# Patient Record
Sex: Male | Born: 1950
Health system: Southern US, Community
[De-identification: ages and names within clinical notes are randomized; demographics above are authoritative.]

## PROBLEM LIST (undated history)

## (undated) DIAGNOSIS — M1990 Unspecified osteoarthritis, unspecified site: Secondary | ICD-10-CM

## (undated) DIAGNOSIS — N400 Enlarged prostate without lower urinary tract symptoms: Secondary | ICD-10-CM

## (undated) DIAGNOSIS — I10 Essential (primary) hypertension: Secondary | ICD-10-CM

## (undated) DIAGNOSIS — E785 Hyperlipidemia, unspecified: Secondary | ICD-10-CM

## (undated) DIAGNOSIS — J309 Allergic rhinitis, unspecified: Secondary | ICD-10-CM

## (undated) DIAGNOSIS — F411 Generalized anxiety disorder: Secondary | ICD-10-CM

## (undated) HISTORY — DX: Generalized anxiety disorder: F41.1

## (undated) HISTORY — PX: CERVICAL SPINE SURGERY: SHX589

## (undated) HISTORY — DX: Hyperlipidemia, unspecified: E78.5

## (undated) HISTORY — PX: HERNIA REPAIR: SHX51

## (undated) HISTORY — DX: Allergic rhinitis, unspecified: J30.9

## (undated) HISTORY — DX: Essential (primary) hypertension: I10

## (undated) HISTORY — DX: Benign prostatic hyperplasia without lower urinary tract symptoms: N40.0

## (undated) HISTORY — DX: Unspecified osteoarthritis, unspecified site: M19.90

## (undated) HISTORY — PX: TONSILLECTOMY AND ADENOIDECTOMY: SHX28

---

## 1977-06-16 HISTORY — PX: GALLBLADDER SURGERY: SHX652

## 2019-08-19 ENCOUNTER — Other Ambulatory Visit: Payer: Self-pay

## 2019-08-19 ENCOUNTER — Ambulatory Visit: Payer: Medicare Other | Attending: Internal Medicine

## 2019-08-19 DIAGNOSIS — Z23 Encounter for immunization: Secondary | ICD-10-CM | POA: Insufficient documentation

## 2019-08-19 NOTE — Progress Notes (Signed)
   Covid-19 Vaccination Clinic  Name:  Johnathan Dominguez    MRN: [id number] DOB: May 28, 1951  08/19/2019  Johnathan Dominguez was observed post Covid-19 immunization for 15 minutes without incident. He was provided with Vaccine Information Sheet and instruction to access the V-Safe system.   Johnathan Dominguez was instructed to call 911 with any severe reactions post vaccine: Marland Kitchen Difficulty breathing  . Swelling of face and throat  . A fast heartbeat  . A bad rash all over body  . Dizziness and weakness

## 2019-08-25 ENCOUNTER — Encounter: Payer: Self-pay | Admitting: Primary Care

## 2019-08-25 ENCOUNTER — Other Ambulatory Visit: Payer: Self-pay

## 2019-08-25 ENCOUNTER — Ambulatory Visit (INDEPENDENT_AMBULATORY_CARE_PROVIDER_SITE_OTHER): Payer: Medicare Other | Admitting: Primary Care

## 2019-08-25 VITALS — BP 136/86 | HR 71 | Temp 97.0°F | Ht 66.0 in | Wt 229.5 lb

## 2019-08-25 DIAGNOSIS — I10 Essential (primary) hypertension: Secondary | ICD-10-CM | POA: Diagnosis not present

## 2019-08-25 DIAGNOSIS — Z125 Encounter for screening for malignant neoplasm of prostate: Secondary | ICD-10-CM

## 2019-08-25 DIAGNOSIS — J309 Allergic rhinitis, unspecified: Secondary | ICD-10-CM | POA: Diagnosis not present

## 2019-08-25 DIAGNOSIS — F411 Generalized anxiety disorder: Secondary | ICD-10-CM

## 2019-08-25 DIAGNOSIS — N400 Enlarged prostate without lower urinary tract symptoms: Secondary | ICD-10-CM

## 2019-08-25 DIAGNOSIS — M1990 Unspecified osteoarthritis, unspecified site: Secondary | ICD-10-CM

## 2019-08-25 DIAGNOSIS — E785 Hyperlipidemia, unspecified: Secondary | ICD-10-CM | POA: Diagnosis not present

## 2019-08-25 LAB — CBC
HCT: 44.9 % (ref 39.0–52.0)
Hemoglobin: 14.8 g/dL (ref 13.0–17.0)
MCHC: 33 g/dL (ref 30.0–36.0)
MCV: 89.1 fl (ref 78.0–100.0)
Platelets: 222 10*3/uL (ref 150.0–400.0)
RBC: 5.03 Mil/uL (ref 4.22–5.81)
RDW: 13.3 % (ref 11.5–15.5)
WBC: 6.2 10*3/uL (ref 4.0–10.5)

## 2019-08-25 LAB — COMPREHENSIVE METABOLIC PANEL
ALT: 62 U/L — ABNORMAL HIGH (ref 0–53)
AST: 37 U/L (ref 0–37)
Albumin: 4 g/dL (ref 3.5–5.2)
Alkaline Phosphatase: 77 U/L (ref 39–117)
BUN: 10 mg/dL (ref 6–23)
CO2: 27 mEq/L (ref 19–32)
Calcium: 9.2 mg/dL (ref 8.4–10.5)
Chloride: 102 mEq/L (ref 96–112)
Creatinine, Ser: 0.91 mg/dL (ref 0.40–1.50)
GFR: 82.3 mL/min (ref >60.00)
Glucose, Bld: 265 mg/dL — ABNORMAL HIGH (ref 70–99)
Potassium: 4.2 mEq/L (ref 3.5–5.1)
Sodium: 136 mEq/L (ref 135–145)
Total Bilirubin: 0.9 mg/dL (ref 0.2–1.2)
Total Protein: 6.4 g/dL (ref 6.0–8.3)

## 2019-08-25 LAB — LIPID PANEL
Cholesterol: 167 mg/dL (ref 0–200)
HDL: 37 mg/dL — ABNORMAL LOW (ref >39.00)
LDL Cholesterol: 107 mg/dL — ABNORMAL HIGH (ref 0–99)
NonHDL: 130.3
Total CHOL/HDL Ratio: 5
Triglycerides: 115 mg/dL (ref 0.0–149.0)
VLDL: 23 mg/dL (ref 0.0–40.0)

## 2019-08-25 LAB — PSA, MEDICARE: PSA: 3.1 ng/ml (ref 0.10–4.00)

## 2019-08-25 NOTE — Assessment & Plan Note (Signed)
Chronic to bilateral knees, does well with Meloxicam daily. Renal function pending. Continue same.

## 2019-08-25 NOTE — Assessment & Plan Note (Signed)
Nocturia improved on tamsulosin. Continue same.

## 2019-08-25 NOTE — Assessment & Plan Note (Signed)
Doing well on Veramyst. Was seeing ENT PRN in [place].  Overall doing well.

## 2019-08-25 NOTE — Assessment & Plan Note (Signed)
Intolerant to statin therapy, severe myalgias.  Doing well on Zetia, lipid panel pending.

## 2019-08-25 NOTE — Progress Notes (Signed)
Subjective:    Patient ID: Johnathan Dominguez, male    DOB: 20-Nov-1950, 69 y.o.   MRN: [id number]  HPI  This visit occurred during the SARS-CoV-2 public health emergency.  Safety protocols were in place, including screening questions prior to the visit, additional usage of staff PPE, and extensive cleaning of exam room while observing appropriate contact time as indicated for disinfecting solutions.   Johnathan Dominguez is a 69 year old male who presents today to establish care and discuss the problems mentioned below. Will obtain/review records.  1) Essential Hypertension: Currently managed on valsartan 160 mg. He does not check his BP at home. He denies chest pain, dizziness, headaches.   BP Readings from Last 3 Encounters:  08/25/19 136/86     2) Hyperlipidemia: Currently managed on Zetia 10 mg. His last lipid panel was about 6 months ago. He has intolerance to statin medications which cause severe lower extremity myalgias. Overall does well on Zetia.   3) BPH: Currently managed on tamsulosin 0.4 mg daily. Initially placed on tamsulosin for nocturia, now waking 1-2 times nightly. He denies difficulty initiating stream, hematuria, incomplete bladder empty.  4) GAD: Currently managed on venlafaxine XR 150 mg for which he's been taking 4-5 years. "Helps to keep me mellow". Denies Si/HI.   5) Chronic Allergies: Currently managed on Veramyst. History of sinus procedure and had sinuses "cleaned out".   6) Osteoarthritis: Chronic pain to bilateral knees. Managed on Meloxicam 15 mg for which he takes daily and is doing well.   Review of Systems  Eyes: Negative for visual disturbance.  Respiratory: Negative for shortness of breath.   Cardiovascular: Negative for chest pain.  Neurological: Negative for dizziness and headaches.       Past Medical History:  Diagnosis Date  . Allergic rhinitis   . BPH (benign prostatic hyperplasia)   . Essential hypertension   . GAD (generalized anxiety  disorder)   . Hyperlipidemia   . Osteoarthritis      Social History   Socioeconomic History  . Marital status: Married    Spouse name: Not on file  . Number of children: Not on file  . Years of education: Not on file  . Highest education level: Not on file  Occupational History  . Not on file  Tobacco Use  . Smoking status: Never Smoker  . Smokeless tobacco: Never Used  Substance and Sexual Activity  . Alcohol use: Not Currently  . Drug use: Not on file  . Sexual activity: Not on file  Other Topics Concern  . Not on file  Social History Narrative  . Not on file   Social Determinants of Health   Financial Resource Strain:   . Difficulty of Paying Living Expenses:   Food Insecurity:   . Worried About Charity fundraiser in the Last Year:   . Arboriculturist in the Last Year:   Transportation Needs:   . Film/video editor (Medical):   Marland Kitchen Lack of Transportation (Non-Medical):   Physical Activity:   . Days of Exercise per Week:   . Minutes of Exercise per Session:   Stress:   . Feeling of Stress :   Social Connections:   . Frequency of Communication with Friends and Family:   . Frequency of Social Gatherings with Friends and Family:   . Attends Religious Services:   . Active Member of Clubs or Organizations:   . Attends Archivist Meetings:   Marland Kitchen Marital Status:  Intimate Partner Violence:   . Fear of Current or Ex-Partner:   . Emotionally Abused:   Marland Kitchen Physically Abused:   . Sexually Abused:     Past Surgical History:  Procedure Laterality Date  . CERVICAL SPINE SURGERY    . [place]  . HERNIA REPAIR    . TONSILLECTOMY AND ADENOIDECTOMY      Family History  Problem Relation Age of Onset  . Heart disease Mother   . Hypertension Mother   . Heart disease Maternal Grandfather     Allergies  Allergen Reactions  . Statins     Current Outpatient Medications on File Prior to Visit  Medication Sig Dispense Refill  . ezetimibe  (ZETIA) 10 MG tablet Take 10 mg by mouth daily.    . fluticasone (VERAMYST) 27.5 MCG/SPRAY nasal spray Place 1 spray into the nose daily.    . meloxicam (MOBIC) 15 MG tablet Take 15 mg by mouth daily.    . tamsulosin (FLOMAX) 0.4 MG CAPS capsule Take 0.4 mg by mouth.    . valsartan (DIOVAN) 160 MG tablet Take 160 mg by mouth daily.    Marland Kitchen venlafaxine XR (EFFEXOR-XR) 150 MG 24 hr capsule Take 150 mg by mouth daily with breakfast.     No current facility-administered medications on file prior to visit.    BP 136/86   Pulse 71   Temp (!) 97 F (36.1 C) (Temporal)   Ht 5\' 6"  (1.676 m)   Wt 209 lb 8 oz (95 kg)   SpO2 98%   BMI 33.81 kg/m    Objective:   Physical Exam  Constitutional: He appears well-nourished.  Cardiovascular: Normal rate and regular rhythm.  Respiratory: Effort normal and breath sounds normal.  Musculoskeletal:     Cervical back: Neck supple.  Skin: Skin is warm and dry.  Psychiatric: He has a normal mood and affect.           Assessment & Plan:

## 2019-08-25 NOTE — Assessment & Plan Note (Signed)
Stable in the office today. CMP pending. Continue valsartan.

## 2019-08-25 NOTE — Assessment & Plan Note (Signed)
Doing well on venlafaxine ER 150 mg. Continue same. Denies SI/HI.

## 2019-08-25 NOTE — Patient Instructions (Signed)
Stop by the lab prior to leaving today. I will notify you of your results once received.   It was a pleasure to meet you today! Please don't hesitate to call or message me with any questions. Welcome to [facility]!

## 2019-08-26 ENCOUNTER — Other Ambulatory Visit (INDEPENDENT_AMBULATORY_CARE_PROVIDER_SITE_OTHER): Payer: Medicare Other

## 2019-08-26 DIAGNOSIS — R739 Hyperglycemia, unspecified: Secondary | ICD-10-CM | POA: Diagnosis not present

## 2019-08-26 LAB — HEMOGLOBIN A1C: Hgb A1c MFr Bld: 12.6 % — ABNORMAL HIGH (ref 4.6–6.5)

## 2019-08-30 ENCOUNTER — Encounter: Payer: Self-pay | Admitting: *Deleted

## 2019-09-01 ENCOUNTER — Encounter: Payer: Self-pay | Admitting: Primary Care

## 2019-09-01 ENCOUNTER — Ambulatory Visit (INDEPENDENT_AMBULATORY_CARE_PROVIDER_SITE_OTHER): Payer: Medicare Other | Admitting: Primary Care

## 2019-09-01 ENCOUNTER — Other Ambulatory Visit: Payer: Self-pay

## 2019-09-01 ENCOUNTER — Telehealth: Payer: Self-pay

## 2019-09-01 DIAGNOSIS — E119 Type 2 diabetes mellitus without complications: Secondary | ICD-10-CM

## 2019-09-01 DIAGNOSIS — E1165 Type 2 diabetes mellitus with hyperglycemia: Secondary | ICD-10-CM | POA: Insufficient documentation

## 2019-09-01 MED ORDER — METFORMIN HCL ER 500 MG PO TB24: 1000.0000 mg | ORAL_TABLET | Freq: Every day | ORAL | 3 refills | Status: DC

## 2019-09-01 MED ORDER — BLOOD GLUCOSE MONITOR KIT: PACK | 0 refills | Status: DC

## 2019-09-01 MED ORDER — GLIPIZIDE ER 10 MG PO TB24: 10.0000 mg | ORAL_TABLET | Freq: Every day | ORAL | 3 refills | Status: DC

## 2019-09-01 NOTE — Patient Instructions (Signed)
Start Metformin ER 500 mg tablets for diabetes. Take 2 tablets every morning with breakfast for diabetes.  Start Glipizide XL 10 mg once daily with breakfast for diabetes.  Start checking your blood sugar levels.  Appropriate times to check your blood sugar levels are:  -Before any meal (breakfast, lunch, dinner) -Two hours after any meal (breakfast, lunch, dinner) -Bedtime  Record your readings and notify me if you continue to consistently run at or above 200.  Please schedule a follow up appointment in 6 weeks for diabetes.   It was a pleasure to see you today!   Diabetes Mellitus and Nutrition, Adult When you have diabetes (diabetes mellitus), it is very important to have healthy eating habits because your blood sugar (glucose) levels are greatly affected by what you eat and drink. Eating healthy foods in the appropriate amounts, at about the same times every day, can help you:  Control your blood glucose.  Lower your risk of heart disease.  Improve your blood pressure.  Reach or maintain a healthy weight. Every person with diabetes is different, and each person has different needs for a meal plan. Your health care provider may recommend that you work with a diet and nutrition specialist (dietitian) to make a meal plan that is best for you. Your meal plan may vary depending on factors such as:  The calories you need.  The medicines you take.  Your weight.  Your blood glucose, blood pressure, and cholesterol levels.  Your activity level.  Other health conditions you have, such as heart or kidney disease. How do carbohydrates affect me? Carbohydrates, also called carbs, affect your blood glucose level more than any other type of food. Eating carbs naturally raises the amount of glucose in your blood. Carb counting is a method for keeping track of how many carbs you eat. Counting carbs is important to keep your blood glucose at a healthy level, especially if you use insulin  or take certain oral diabetes medicines. It is important to know how many carbs you can safely have in each meal. This is different for every person. Your dietitian can help you calculate how many carbs you should have at each meal and for each snack. Foods that contain carbs include:  Bread, cereal, rice, pasta, and crackers.  Potatoes and corn.  Peas, beans, and lentils.  Milk and yogurt.  Fruit and juice.  Desserts, such as cakes, cookies, ice cream, and candy. How does alcohol affect me? Alcohol can cause a sudden decrease in blood glucose (hypoglycemia), especially if you use insulin or take certain oral diabetes medicines. Hypoglycemia can be a life-threatening condition. Symptoms of hypoglycemia (sleepiness, dizziness, and confusion) are similar to symptoms of having too much alcohol. If your health care provider says that alcohol is safe for you, follow these guidelines:  Limit alcohol intake to no more than 1 drink per day for nonpregnant women and 2 drinks per day for men. One drink equals 12 oz of beer, 5 oz of wine, or 1 oz of hard liquor.  Do not drink on an empty stomach.  Keep yourself hydrated with water, diet soda, or unsweetened iced tea.  Keep in mind that regular soda, juice, and other mixers may contain a lot of sugar and must be counted as carbs. What are tips for following this plan?  Reading food labels  Start by checking the serving size on the "Nutrition Facts" label of packaged foods and drinks. The amount of calories, carbs, fats, and other  nutrients listed on the label is based on one serving of the item. Many items contain more than one serving per package.  Check the total grams (g) of carbs in one serving. You can calculate the number of servings of carbs in one serving by dividing the total carbs by 15. For example, if a food has 30 g of total carbs, it would be equal to 2 servings of carbs.  Check the number of grams (g) of saturated and trans fats  in one serving. Choose foods that have low or no amount of these fats.  Check the number of milligrams (mg) of salt (sodium) in one serving. Most people should limit total sodium intake to less than 2,300 mg per day.  Always check the nutrition information of foods labeled as "low-fat" or "nonfat". These foods may be higher in added sugar or refined carbs and should be avoided.  Talk to your dietitian to identify your daily goals for nutrients listed on the label. Shopping  Avoid buying canned, premade, or processed foods. These foods tend to be high in fat, sodium, and added sugar.  Shop around the outside edge of the grocery store. This includes fresh fruits and vegetables, bulk grains, fresh meats, and fresh dairy. Cooking  Use low-heat cooking methods, such as baking, instead of high-heat cooking methods like deep frying.  Cook using healthy oils, such as olive, canola, or sunflower oil.  Avoid cooking with butter, cream, or high-fat meats. Meal planning  Eat meals and snacks regularly, preferably at the same times every day. Avoid going long periods of time without eating.  Eat foods high in fiber, such as fresh fruits, vegetables, beans, and whole grains. Talk to your dietitian about how many servings of carbs you can eat at each meal.  Eat 4-6 ounces (oz) of lean protein each day, such as lean meat, chicken, fish, eggs, or tofu. One oz of lean protein is equal to: ? 1 oz of meat, chicken, or fish. ? 1 egg. ?  cup of tofu.  Eat some foods each day that contain healthy fats, such as avocado, nuts, seeds, and fish. Lifestyle  Check your blood glucose regularly.  Exercise regularly as told by your health care provider. This may include: ? 150 minutes of moderate-intensity or vigorous-intensity exercise each week. This could be brisk walking, biking, or water aerobics. ? Stretching and doing strength exercises, such as yoga or weightlifting, at least 2 times a week.  Take  medicines as told by your health care provider.  Do not use any products that contain nicotine or tobacco, such as cigarettes and e-cigarettes. If you need help quitting, ask your health care provider.  Work with a Social worker or diabetes educator to identify strategies to manage stress and any emotional and social challenges. Questions to ask a health care provider  Do I need to meet with a diabetes educator?  Do I need to meet with a dietitian?  What number can I call if I have questions?  When are the best times to check my blood glucose? Where to find more information:  American Diabetes Association: diabetes.org  Academy of Nutrition and Dietetics: www.eatright.[employer] of Diabetes and Digestive and Kidney Diseases (NIH): [employer] Summary  A healthy meal plan will help you control your blood glucose and maintain a healthy lifestyle.  Working with a diet and nutrition specialist (dietitian) can help you make a meal plan that is best for you.  Keep in mind that carbohydrates (  carbs) and alcohol have immediate effects on your blood glucose levels. It is important to count carbs and to use alcohol carefully. This information is not intended to replace advice given to you by your health care provider. Make sure you discuss any questions you have with your health care provider. Document Revised: 05/15/2017 Document Reviewed: 07/07/2016 Elsevier Patient Education  2020 [employer].

## 2019-09-01 NOTE — Addendum Note (Signed)
Addended by: Jacqualin Combes on: 09/01/2019 02:03 PM   Modules accepted: Orders

## 2019-09-01 NOTE — Telephone Encounter (Signed)
Pt said [facility] did not get the glucose meter,test strips and lancets rx. Pt will ck with his ins.co to see what diabetic supplies his ins co approves. Pt will cb with information. [facility] [address]. Will also need to use E11.9 for dx code.

## 2019-09-01 NOTE — Telephone Encounter (Signed)
Called [facility] and had the pharmacist re-check to fax machine and at the same time, I had re-fax the script.

## 2019-09-01 NOTE — Assessment & Plan Note (Signed)
Diagnosed 4-5 years ago, no treatment really ever.  Rx for Metformin ER 500 mg sent to pharmacy. Will have him take 1000 mg once daily to start. May increase to 1000 mg BID if he tolerates well.  Given his A1C level of 12.6, I recommended we treat with long acting insulin, he kindly declines. He does agree to dual oral agent therapy so we will send in Glipizide XL 10 mg.   Rx for glucometer kit sent to pharmacy, we discussed how and when to check glucose levels. Glucose log sheets provided. We will see him back in 6 weeks with glucose logs.

## 2019-09-01 NOTE — Progress Notes (Signed)
Subjective:    Patient ID: Johnathan Dominguez, male    DOB: December 22, 1950, 69 y.o.   MRN: [id number]  HPI  This visit occurred during the SARS-CoV-2 public health emergency.  Safety protocols were in place, including screening questions prior to the visit, additional usage of staff PPE, and extensive cleaning of exam room while observing appropriate contact time as indicated for disinfecting solutions.   Mr. Entrikin is a 69 year old male with a history of hypertension, hyperlipidemia who presents today to discuss diabetes. He was last evaluated last week as a new patient, lab work completed with A1C result of 12.6.   During his initial visit he made no mention of a diabetes history, therefore, he was brought in today. Today he mentions a 5-6 year history of diabetes.  He was once managed on Metformin twice daily which caused diarrhea and GI upset so he didn't take this for very long. He has not taken Metformin in about one year.   He denies numbness/tingling, polyuria, polydipsia, chest pain, dizziness. He does not check his blood sugars.   Review of Systems  Constitutional: Negative for fatigue.  Eyes: Negative for visual disturbance.  Respiratory: Negative for shortness of breath.   Cardiovascular: Negative for chest pain.  Neurological: Negative for dizziness and numbness.       Past Medical History:  Diagnosis Date  . Allergic rhinitis   . BPH (benign prostatic hyperplasia)   . Essential hypertension   . GAD (generalized anxiety disorder)   . Hyperlipidemia   . Osteoarthritis      Social History   Socioeconomic History  . Marital status: Married    Spouse name: Not on file  . Number of children: Not on file  . Years of education: Not on file  . Highest education level: Not on file  Occupational History  . Not on file  Tobacco Use  . Smoking status: Never Smoker  . Smokeless tobacco: Never Used  Substance and Sexual Activity  . Alcohol use: Not Currently  . Drug  use: Not on file  . Sexual activity: Not on file  Other Topics Concern  . Not on file  Social History Narrative  . Not on file   Social Determinants of Health   Financial Resource Strain:   . Difficulty of Paying Living Expenses:   Food Insecurity:   . Worried About Charity fundraiser in the Last Year:   . Arboriculturist in the Last Year:   Transportation Needs:   . Film/video editor (Medical):   Marland Kitchen Lack of Transportation (Non-Medical):   Physical Activity:   . Days of Exercise per Week:   . Minutes of Exercise per Session:   Stress:   . Feeling of Stress :   Social Connections:   . Frequency of Communication with Friends and Family:   . Frequency of Social Gatherings with Friends and Family:   . Attends Religious Services:   . Active Member of Clubs or Organizations:   . Attends Archivist Meetings:   Marland Kitchen Marital Status:   Intimate Partner Violence:   . Fear of Current or Ex-Partner:   . Emotionally Abused:   Marland Kitchen Physically Abused:   . Sexually Abused:     Past Surgical History:  Procedure Laterality Date  . CERVICAL SPINE SURGERY    . [place]  . HERNIA REPAIR    . TONSILLECTOMY AND ADENOIDECTOMY      Family History  Problem Relation Age of Onset  . Heart disease Mother   . Hypertension Mother   . Heart disease Maternal Grandfather     Allergies  Allergen Reactions  . Statins     Current Outpatient Medications on File Prior to Visit  Medication Sig Dispense Refill  . ezetimibe (ZETIA) 10 MG tablet Take 10 mg by mouth daily.    . fluticasone (VERAMYST) 27.5 MCG/SPRAY nasal spray Place 1 spray into the nose daily.    . meloxicam (MOBIC) 15 MG tablet Take 15 mg by mouth daily.    . tamsulosin (FLOMAX) 0.4 MG CAPS capsule Take 0.4 mg by mouth.    . valsartan (DIOVAN) 160 MG tablet Take 160 mg by mouth daily.    Marland Kitchen venlafaxine XR (EFFEXOR-XR) 150 MG 24 hr capsule Take 150 mg by mouth daily with breakfast.     No current  facility-administered medications on file prior to visit.    BP 134/84   Pulse 73   Temp (!) 96.8 F (36 C) (Temporal)   Ht 5\' 6"  (1.676 m)   Wt 224 lb 8 oz (101.8 kg)   SpO2 97%   BMI 36.24 kg/m    Objective:   Physical Exam  Constitutional: He appears well-nourished.  Cardiovascular: Normal rate and regular rhythm.  Respiratory: Effort normal and breath sounds normal.  Musculoskeletal:     Cervical back: Neck supple.  Skin: Skin is warm and dry.  Psychiatric: He has a normal mood and affect.           Assessment & Plan:

## 2019-09-07 ENCOUNTER — Other Ambulatory Visit: Payer: Self-pay

## 2019-09-07 MED ORDER — ONETOUCH ULTRA VI STRP: ORAL_STRIP | 12 refills | Status: DC

## 2019-09-07 NOTE — Telephone Encounter (Signed)
Received fax from [facility] asking to re send test strips with updated sig of checking sugar once daily due to insurance and patient is not on insulin. RX resent with updated sig.

## 2019-09-09 ENCOUNTER — Ambulatory Visit: Payer: Medicare Other | Attending: Internal Medicine

## 2019-09-09 DIAGNOSIS — Z23 Encounter for immunization: Secondary | ICD-10-CM

## 2019-09-09 NOTE — Progress Notes (Signed)
   Covid-19 Vaccination Clinic  Name:  Johnathan Dominguez    MRN: [id number] DOB: 15-Oct-1950  09/09/2019  Mr. Sheetz was observed post Covid-19 immunization for 15 minutes without incident. He was provided with Vaccine Information Sheet and instruction to access the V-Safe system.   Mr. Merryweather was instructed to call 911 with any severe reactions post vaccine: Marland Kitchen Difficulty breathing  . Swelling of face and throat  . A fast heartbeat  . A bad rash all over body  . Dizziness and weakness   Immunizations Administered    Name Date Dose VIS Date Route   Pfizer COVID-19 Vaccine 09/09/2019 10:49 AM 0.3 mL 05/27/2019 Intramuscular   Manufacturer: [place]   Lot: [id number]   [place]: [id number]

## 2019-09-13 ENCOUNTER — Telehealth: Payer: Self-pay | Admitting: Primary Care

## 2019-09-13 NOTE — Telephone Encounter (Signed)
Rec'd from [place] forwarded 33 pages to Dr. Alma Friendly

## 2019-09-15 ENCOUNTER — Telehealth: Payer: Self-pay | Admitting: Primary Care

## 2019-09-15 NOTE — Telephone Encounter (Signed)
Patient called today stating he is needing some help learning how to use his meter. He stated that when he pricks his finger he is not getting enough blood for the reader.  Patient wanted to know if there is someone here that could help him

## 2019-09-20 NOTE — Telephone Encounter (Signed)
Spoken to patient and inform him that he may come any time between 12 pm to 1 pm, either today, Wednesday or Thursday   Patient verbalized understanding.

## 2019-10-13 ENCOUNTER — Encounter: Payer: Self-pay | Admitting: Primary Care

## 2019-10-13 ENCOUNTER — Other Ambulatory Visit: Payer: Self-pay

## 2019-10-13 ENCOUNTER — Ambulatory Visit (INDEPENDENT_AMBULATORY_CARE_PROVIDER_SITE_OTHER): Payer: Medicare Other | Admitting: Primary Care

## 2019-10-13 VITALS — BP 124/82 | HR 76 | Temp 96.7°F | Ht 66.0 in | Wt 212.0 lb

## 2019-10-13 DIAGNOSIS — Z1283 Encounter for screening for malignant neoplasm of skin: Secondary | ICD-10-CM

## 2019-10-13 DIAGNOSIS — E119 Type 2 diabetes mellitus without complications: Secondary | ICD-10-CM | POA: Diagnosis not present

## 2019-10-13 DIAGNOSIS — N400 Enlarged prostate without lower urinary tract symptoms: Secondary | ICD-10-CM

## 2019-10-13 MED ORDER — ONETOUCH ULTRA VI STRP: ORAL_STRIP | 5 refills | Status: DC

## 2019-10-13 MED ORDER — TAMSULOSIN HCL 0.4 MG PO CAPS: 0.4000 mg | ORAL_CAPSULE | Freq: Every day | ORAL | 2 refills | Status: DC

## 2019-10-13 NOTE — Assessment & Plan Note (Signed)
Glucose readings in AM are under excellent control, discussed to check at different times of the day. Commended him on weight loss.   Continue Glipizide and Metformin. Pneumonia vaccination UTD per patient, I do not yet have records. Foot exam UTD. Eye exam UTD per patient.  Follow up in 2 months for repeat A1C.

## 2019-10-13 NOTE — Assessment & Plan Note (Signed)
Refill provided for tamsulosin.

## 2019-10-13 NOTE — Progress Notes (Signed)
Subjective:    Patient ID: Johnathan Dominguez, male    DOB: 02/23/1951, 69 y.o.   MRN: [id number]  HPI  This visit occurred during the SARS-CoV-2 public health emergency.  Safety protocols were in place, including screening questions prior to the visit, additional usage of staff PPE, and extensive cleaning of exam room while observing appropriate contact time as indicated for disinfecting solutions.   Johnathan Dominguez is a 69 year old male with a history of hypertension, type 2 diabetes, BPH, hyperlipidemia who presents today for follow up of diabetes.  He was last evaluated on 09/01/19 for evaluation of A1C of 12.6, especially since he gave no indication during his initial visit on 08/25/19 that he had a history of diabetes. During his most recent visit he was initiated on Glipizide XL 10 mg, Metformin XR 1000 mg, and prescribed a glucometer. He was instructed to check glucose levels daily and follow up 6 weeks later.  Since his last visit he is checking his blood sugars once daily and is getting readings of:  AM fasting: 90's-low 120's.  He ran out of test strips one week ago so he hasn't checked his glucose for one week. He has improved his diet and is eating salads and protein.   BP Readings from Last 3 Encounters:  10/13/19 124/82  09/01/19 134/84  08/25/19 136/86   Wt Readings from Last 3 Encounters:  10/13/19 212 lb (96.2 kg)  09/01/19 224 lb 8 oz (101.8 kg)  08/25/19 229 lb 8 oz (104.1 kg)   He is also needing a refill of his tamsulosin and would like to be referred to dermatology for a skin check. He was once seeing dermatology in [place].   He saw his eye doctor in early March prior to moving to [place]. He also endorses having had three pneumonia vaccinations with his last one being last year.   Review of Systems  Eyes: Negative for visual disturbance.  Respiratory: Negative for shortness of breath.   Cardiovascular: Negative for chest pain.  Neurological: Negative for  dizziness.       Past Medical History:  Diagnosis Date  . Allergic rhinitis   . BPH (benign prostatic hyperplasia)   . Essential hypertension   . GAD (generalized anxiety disorder)   . Hyperlipidemia   . Osteoarthritis      Social History   Socioeconomic History  . Marital status: Married    Spouse name: Not on file  . Number of children: Not on file  . Years of education: Not on file  . Highest education level: Not on file  Occupational History  . Not on file  Tobacco Use  . Smoking status: Never Smoker  . Smokeless tobacco: Never Used  Substance and Sexual Activity  . Alcohol use: Not Currently  . Drug use: Not on file  . Sexual activity: Not on file  Other Topics Concern  . Not on file  Social History Narrative  . Not on file   Social Determinants of Health   Financial Resource Strain:   . Difficulty of Paying Living Expenses:   Food Insecurity:   . Worried About Charity fundraiser in the Last Year:   . Arboriculturist in the Last Year:   Transportation Needs:   . Film/video editor (Medical):   Marland Kitchen Lack of Transportation (Non-Medical):   Physical Activity:   . Days of Exercise per Week:   . Minutes of Exercise per Session:   Stress:   .  Feeling of Stress :   Social Connections:   . Frequency of Communication with Friends and Family:   . Frequency of Social Gatherings with Friends and Family:   . Attends Religious Services:   . Active Member of Clubs or Organizations:   . Attends Archivist Meetings:   Marland Kitchen Marital Status:   Intimate Partner Violence:   . Fear of Current or Ex-Partner:   . Emotionally Abused:   Marland Kitchen Physically Abused:   . Sexually Abused:     Past Surgical History:  Procedure Laterality Date  . CERVICAL SPINE SURGERY    . [place]  . HERNIA REPAIR    . TONSILLECTOMY AND ADENOIDECTOMY      Family History  Problem Relation Age of Onset  . Heart disease Mother   . Hypertension Mother   . Heart  disease Maternal Grandfather     Allergies  Allergen Reactions  . Statins     Current Outpatient Medications on File Prior to Visit  Medication Sig Dispense Refill  . blood glucose meter kit and supplies KIT Use up to four times daily as directed. (FOR ICD-9 250.00, 250.01). 1 each 0  . ezetimibe (ZETIA) 10 MG tablet Take 10 mg by mouth daily.    . fluticasone (VERAMYST) 27.5 MCG/SPRAY nasal spray Place 1 spray into the nose daily.    Marland Kitchen glipiZIDE (GLUCOTROL XL) 10 MG 24 hr tablet Take 1 tablet (10 mg total) by mouth daily with breakfast. For diabetes. 90 tablet 3  . meloxicam (MOBIC) 15 MG tablet Take 15 mg by mouth daily.    . metFORMIN (GLUCOPHAGE-XR) 500 MG 24 hr tablet Take 2 tablets (1,000 mg total) by mouth daily with breakfast. For diabetes. 180 tablet 3  . valsartan (DIOVAN) 160 MG tablet Take 160 mg by mouth daily.    Marland Kitchen venlafaxine XR (EFFEXOR-XR) 150 MG 24 hr capsule Take 150 mg by mouth daily with breakfast.     No current facility-administered medications on file prior to visit.    BP 124/82   Pulse 76   Temp (!) 96.7 F (35.9 C) (Temporal)   Ht 5' 6"  (1.676 m)   Wt 212 lb (96.2 kg)   SpO2 97%   BMI 34.22 kg/m    Objective:   Physical Exam  Constitutional: He appears well-nourished.  Cardiovascular: Normal rate and regular rhythm.  Respiratory: Effort normal and breath sounds normal.  Musculoskeletal:     Cervical back: Neck supple.  Skin: Skin is warm and dry.  Psychiatric: He has a normal mood and affect.           Assessment & Plan:

## 2019-10-13 NOTE — Patient Instructions (Signed)
Continue checking your blood sugar levels.  Appropriate times to check your blood sugar levels are:  -Before any meal (breakfast, lunch, dinner) -Two hours after any meal (breakfast, lunch, dinner) -Bedtime  Record your readings and notify me if you continue to consistently run at or above 200.  You will be contacted regarding your referral to dermatology.  Please let us know if you have not been contacted within two weeks.   Schedule a follow up visit for late June 2021.  It was a pleasure to see you today!   Diabetes Mellitus and Nutrition, Adult When you have diabetes (diabetes mellitus), it is very important to have healthy eating habits because your blood sugar (glucose) levels are greatly affected by what you eat and drink. Eating healthy foods in the appropriate amounts, at about the same times every day, can help you:  Control your blood glucose.  Lower your risk of heart disease.  Improve your blood pressure.  Reach or maintain a healthy weight. Every person with diabetes is different, and each person has different needs for a meal plan. Your health care provider may recommend that you work with a diet and nutrition specialist (dietitian) to make a meal plan that is best for you. Your meal plan may vary depending on factors such as:  The calories you need.  The medicines you take.  Your weight.  Your blood glucose, blood pressure, and cholesterol levels.  Your activity level.  Other health conditions you have, such as heart or kidney disease. How do carbohydrates affect me? Carbohydrates, also called carbs, affect your blood glucose level more than any other type of food. Eating carbs naturally raises the amount of glucose in your blood. Carb counting is a method for keeping track of how many carbs you eat. Counting carbs is important to keep your blood glucose at a healthy level, especially if you use insulin or take certain oral diabetes medicines. It is important  to know how many carbs you can safely have in each meal. This is different for every person. Your dietitian can help you calculate how many carbs you should have at each meal and for each snack. Foods that contain carbs include:  Bread, cereal, rice, pasta, and crackers.  Potatoes and corn.  Peas, beans, and lentils.  Milk and yogurt.  Fruit and juice.  Desserts, such as cakes, cookies, ice cream, and candy. How does alcohol affect me? Alcohol can cause a sudden decrease in blood glucose (hypoglycemia), especially if you use insulin or take certain oral diabetes medicines. Hypoglycemia can be a life-threatening condition. Symptoms of hypoglycemia (sleepiness, dizziness, and confusion) are similar to symptoms of having too much alcohol. If your health care provider says that alcohol is safe for you, follow these guidelines:  Limit alcohol intake to no more than 1 drink per day for nonpregnant women and 2 drinks per day for men. One drink equals 12 oz of beer, 5 oz of wine, or 1 oz of hard liquor.  Do not drink on an empty stomach.  Keep yourself hydrated with water, diet soda, or unsweetened iced tea.  Keep in mind that regular soda, juice, and other mixers may contain a lot of sugar and must be counted as carbs. What are tips for following this plan?  Reading food labels  Start by checking the serving size on the "Nutrition Facts" label of packaged foods and drinks. The amount of calories, carbs, fats, and other nutrients listed on the label is based on  one serving of the item. Many items contain more than one serving per package.  Check the total grams (g) of carbs in one serving. You can calculate the number of servings of carbs in one serving by dividing the total carbs by 15. For example, if a food has 30 g of total carbs, it would be equal to 2 servings of carbs.  Check the number of grams (g) of saturated and trans fats in one serving. Choose foods that have low or no amount  of these fats.  Check the number of milligrams (mg) of salt (sodium) in one serving. Most people should limit total sodium intake to less than 2,300 mg per day.  Always check the nutrition information of foods labeled as "low-fat" or "nonfat". These foods may be higher in added sugar or refined carbs and should be avoided.  Talk to your dietitian to identify your daily goals for nutrients listed on the label. Shopping  Avoid buying canned, premade, or processed foods. These foods tend to be high in fat, sodium, and added sugar.  Shop around the outside edge of the grocery store. This includes fresh fruits and vegetables, bulk grains, fresh meats, and fresh dairy. Cooking  Use low-heat cooking methods, such as baking, instead of high-heat cooking methods like deep frying.  Cook using healthy oils, such as olive, canola, or sunflower oil.  Avoid cooking with butter, cream, or high-fat meats. Meal planning  Eat meals and snacks regularly, preferably at the same times every day. Avoid going long periods of time without eating.  Eat foods high in fiber, such as fresh fruits, vegetables, beans, and whole grains. Talk to your dietitian about how many servings of carbs you can eat at each meal.  Eat 4-6 ounces (oz) of lean protein each day, such as lean meat, chicken, fish, eggs, or tofu. One oz of lean protein is equal to: ? 1 oz of meat, chicken, or fish. ? 1 egg. ?  cup of tofu.  Eat some foods each day that contain healthy fats, such as avocado, nuts, seeds, and fish. Lifestyle  Check your blood glucose regularly.  Exercise regularly as told by your health care provider. This may include: ? 150 minutes of moderate-intensity or vigorous-intensity exercise each week. This could be brisk walking, biking, or water aerobics. ? Stretching and doing strength exercises, such as yoga or weightlifting, at least 2 times a week.  Take medicines as told by your health care provider.  Do not  use any products that contain nicotine or tobacco, such as cigarettes and e-cigarettes. If you need help quitting, ask your health care provider.  Work with a Social worker or diabetes educator to identify strategies to manage stress and any emotional and social challenges. Questions to ask a health care provider  Do I need to meet with a diabetes educator?  Do I need to meet with a dietitian?  What number can I call if I have questions?  When are the best times to check my blood glucose? Where to find more information:  American Diabetes Association: diabetes.org  Academy of Nutrition and Dietetics: www.eatright.[employer] of Diabetes and Digestive and Kidney Diseases (NIH): [employer] Summary  A healthy meal plan will help you control your blood glucose and maintain a healthy lifestyle.  Working with a diet and nutrition specialist (dietitian) can help you make a meal plan that is best for you.  Keep in mind that carbohydrates (carbs) and alcohol have immediate effects on your  blood glucose levels. It is important to count carbs and to use alcohol carefully. This information is not intended to replace advice given to you by your health care provider. Make sure you discuss any questions you have with your health care provider. Document Revised: 05/15/2017 Document Reviewed: 07/07/2016 Elsevier Patient Education  2020 [employer].

## 2019-11-16 ENCOUNTER — Telehealth: Payer: Self-pay

## 2019-11-16 NOTE — Telephone Encounter (Signed)
Noted and agree with recommendations.  Vallarie Mare, please call to check on patient 11/17/19.

## 2019-11-16 NOTE — Telephone Encounter (Signed)
[facility] - Client TELEPHONE ADVICE RECORD [facility] Patient Name: Johnathan Dominguez [facility] Gender: Male DOB: 11-Jul-1950 Age: 69 Y 62 M 26 D Return Phone Number: [phone] (Primary) Address: City/State/Zip: [place] [address] Client [facility] - Client Client Site [place] - Day Physician Alma Friendly - NP Contact Type Call Who Is Calling Patient / Member / Family / Caregiver Call Type Triage / Clinical Relationship To Patient Self Return Phone Number [phone] (Primary) Chief Complaint Constipation Reason for Call Symptomatic / Request for Health Information Initial Comment Caller states he has not had bowel movement monday. He is constipated. Translation No Nurse Assessment Nurse: Thad Ranger, RN, Denise Date/Time (Eastern Time): 11/16/2019 9:38:55 AM Confirm and document reason for call. If symptomatic, describe symptoms. ---Caller states he has not had bowel movement Monday. He is constipated. Has the patient had close contact with a person known or suspected to have the novel coronavirus illness OR traveled / lives in area with major community spread (including international travel) in the last 14 days from the onset of symptoms? * If Asymptomatic, screen for exposure and travel within the last 14 days. ---No Does the patient have any new or worsening symptoms? ---Yes Will a triage be completed? ---Yes Related visit to physician within the last 2 weeks? ---No Does the PT have any chronic conditions? (i.e. diabetes, asthma, this includes High risk factors for pregnancy, etc.) ---Yes List chronic conditions. ---NIDDM Is this a behavioral health or substance abuse call? ---No Guidelines Guideline Title Affirmed Question Affirmed Notes Nurse Date/Time Eilene Ghazi Time) Constipation MILD constipation Carmon, RN, Langley Gauss 11/16/2019 9:39:50 AM Disp. Time Eilene Ghazi Time) Disposition Final User 11/16/2019  9:45:44 AM Home Care Yes Carmon, RN, Langley Gauss PLEASE NOTE: All timestamps contained within this report are represented as [place] Standard Time. CONFIDENTIALTY NOTICE: This fax transmission is intended only for the addressee. It contains information that is legally privileged, confidential or otherwise protected from use or disclosure. If you are not the intended recipient, you are strictly prohibited from reviewing, disclosing, copying using or disseminating any of this information or taking any action in reliance on or regarding this information. If you have received this fax in error, please notify us immediately by telephone so that we can arrange for its return to [place]. Phone: [phone], Toll-Free: [phone], Fax: [phone] Page: 2 of 2 Call Id: [id number] Caller Disagree/Comply Comply Caller Understands Yes PreDisposition Call Doctor Care Advice Given Per Guideline HOME CARE: * You should be able to treat this at home. GENERAL CONSTIPATION INSTRUCTIONS: * Eat a high fiber diet. * Drink adequate liquids. HIGH FIBER DIET: * A high fiber diet will help improve your intestinal function and soften your BM's. The fiber works by holding more water in your stools. * Try to eat fruit and vegetables at each meal (peas, prunes, citrus, apples, beans, corn). * Eat more grain foods (bran flakes, bran muffins, graham crackers, oatmeal, brown rice, and whole wheat bread. LIQUIDS: * Drink 6-8 glasses of water a day. (Caution: certain medical conditions require fluid restriction) * Prune juice is a natural laxative. CALL BACK IF: * Abdominal swelling, vomiting or fever occur * You become worse. CARE ADVICE given per Constipation (Adult) guideline.

## 2019-11-17 NOTE — Telephone Encounter (Signed)
Message left for patient to return my call.  

## 2019-11-18 ENCOUNTER — Ambulatory Visit (INDEPENDENT_AMBULATORY_CARE_PROVIDER_SITE_OTHER)
Admission: RE | Admit: 2019-11-18 | Discharge: 2019-11-18 | Disposition: A | Discharge status: Home / Self Care | Payer: Medicare Other | Source: Ambulatory Visit | Attending: Primary Care | Admitting: Primary Care

## 2019-11-18 ENCOUNTER — Encounter: Payer: Self-pay | Admitting: Primary Care

## 2019-11-18 ENCOUNTER — Ambulatory Visit (INDEPENDENT_AMBULATORY_CARE_PROVIDER_SITE_OTHER): Payer: Medicare Other | Admitting: Primary Care

## 2019-11-18 ENCOUNTER — Other Ambulatory Visit: Payer: Self-pay

## 2019-11-18 VITALS — BP 122/82 | HR 72 | Temp 95.8°F | Ht 66.0 in | Wt 207.5 lb

## 2019-11-18 DIAGNOSIS — K5900 Constipation, unspecified: Secondary | ICD-10-CM

## 2019-11-18 DIAGNOSIS — R8290 Unspecified abnormal findings in urine: Secondary | ICD-10-CM | POA: Diagnosis not present

## 2019-11-18 HISTORY — DX: Constipation, unspecified: K59.00

## 2019-11-18 LAB — POC URINALSYSI DIPSTICK (AUTOMATED)
Bilirubin, UA: NEGATIVE
Blood, UA: NEGATIVE
Glucose, UA: NEGATIVE
Ketones, UA: NEGATIVE
Leukocytes, UA: NEGATIVE
Nitrite, UA: NEGATIVE
Protein, UA: NEGATIVE
Spec Grav, UA: 1.02 (ref 1.010–1.025)
Urobilinogen, UA: 0.2 E.U./dL
pH, UA: 6 (ref 5.0–8.0)

## 2019-11-18 NOTE — Telephone Encounter (Addendum)
Spoken to patient and he stated that he is already doing this. Patient stated that it has been becoming unbearable, he believes he may have a henria. Patient would prefer to come here. I have schedule patient at 12 pm.

## 2019-11-18 NOTE — Telephone Encounter (Signed)
Patient contacted the office. He states he did have a small bowel movement yesterday, but he does not feel like he was able to empty his bowels completely. He states he does have very mild abdominal discomfort - but no other sx. He has been trying to drink more water and eating high fiber foods, but states he cannot have a complete bowel movement.

## 2019-11-18 NOTE — Assessment & Plan Note (Addendum)
Acute for the last four days, no changes in diet or water intake prior. He did have a large successful movement after 2 enemas and suppository.   Lower suspicion for obstruction given passing gas, normal bowel movements, and lack of nausea and vomiting. Exam today stable.  Check plain films of abdomen today.  Increase Miralax to BID until he has a movement, then reduce to once daily. Discussed to discontinue if he experiences diarrhea.  He will update.

## 2019-11-18 NOTE — Progress Notes (Signed)
Subjective:    Patient ID: Johnathan Dominguez, male    DOB: [birth year], 69 y.o.   MRN: [id number]  HPI  This visit occurred during the SARS-CoV-2 public health emergency.  Safety protocols were in place, including screening questions prior to the visit, additional usage of staff PPE, and extensive cleaning of exam room while observing appropriate contact time as indicated for disinfecting solutions.   Mr. Johnathan Dominguez is a 69 year old male with a history of hypertension, type 2 diabetes, BPH, hyperlipidemia who presents today with a chief complaint of constipation.  He typically has bowel movements once daily, everyday. He did not have a bowel movement during the entire day on Monday (four days ago), but felt the urge that evening. Despite the urge, he could not have a movement so he used two enemas and one suppositories. He did have a very large bowel movement that evening.   He did not have a bowel movement on Tuesday or Wednesday, but did have a small bowel movement last night (Thursday). He's had no bowel movement today. He started Miralax last night.   He's also noticed foul smelling urine, lower back pain, increased gas. He denies hematuria, dysuria, urinary frequency, bloody stools, nausea, vomiting, abdominal pain.   He endorses drinking a lot of water, also eats a healthy diet. No recent changes in his diet.    Review of Systems  Constitutional: Negative for fever.  Gastrointestinal: Positive for constipation. Negative for abdominal pain, blood in stool, nausea and vomiting.       Past Medical History:  Diagnosis Date  . Allergic rhinitis   . BPH (benign prostatic hyperplasia)   . Essential hypertension   . GAD (generalized anxiety disorder)   . Hyperlipidemia   . Osteoarthritis      Social History   Socioeconomic History  . Marital status: Married    Spouse name: Not on file  . Number of children: Not on file  . Years of education: Not on file  . Highest education  level: Not on file  Occupational History  . Not on file  Tobacco Use  . Smoking status: Never Smoker  . Smokeless tobacco: Never Used  Substance and Sexual Activity  . Alcohol use: Not Currently  . Drug use: Not on file  . Sexual activity: Not on file  Other Topics Concern  . Not on file  Social History Narrative  . Not on file   Social Determinants of Health   Financial Resource Strain:   . Difficulty of Paying Living Expenses:   Food Insecurity:   . Worried About Charity fundraiser in the Last Year:   . Arboriculturist in the Last Year:   Transportation Needs:   . Film/video editor (Medical):   Marland Kitchen Lack of Transportation (Non-Medical):   Physical Activity:   . Days of Exercise per Week:   . Minutes of Exercise per Session:   Stress:   . Feeling of Stress :   Social Connections:   . Frequency of Communication with Friends and Family:   . Frequency of Social Gatherings with Friends and Family:   . Attends Religious Services:   . Active Member of Clubs or Organizations:   . Attends Archivist Meetings:   Marland Kitchen Marital Status:   Intimate Partner Violence:   . Fear of Current or Ex-Partner:   . Emotionally Abused:   Marland Kitchen Physically Abused:   . Sexually Abused:     Past Surgical  History:  Procedure Laterality Date  . CERVICAL SPINE SURGERY    . GALLBLADDER SURGERY  1979  . HERNIA REPAIR    . TONSILLECTOMY AND ADENOIDECTOMY      Family History  Problem Relation Age of Onset  . Heart disease Mother   . Hypertension Mother   . Heart disease Maternal Grandfather     Allergies  Allergen Reactions  . Statins     Current Outpatient Medications on File Prior to Visit  Medication Sig Dispense Refill  . blood glucose meter kit and supplies KIT Use up to four times daily as directed. (FOR ICD-9 250.00, 250.01). 1 each 0  . ezetimibe (ZETIA) 10 MG tablet Take 10 mg by mouth daily.    . fluticasone (VERAMYST) 27.5 MCG/SPRAY nasal spray Place 1 spray into the  nose daily.    . glipiZIDE (GLUCOTROL XL) 10 MG 24 hr tablet Take 1 tablet (10 mg total) by mouth daily with breakfast. For diabetes. 90 tablet 3  . glucose blood (ONETOUCH ULTRA) test strip Check sugar 2-3 times daily DX E11.9 100 each 5  . meloxicam (MOBIC) 15 MG tablet Take 15 mg by mouth daily.    . metFORMIN (GLUCOPHAGE-XR) 500 MG 24 hr tablet Take 2 tablets (1,000 mg total) by mouth daily with breakfast. For diabetes. 180 tablet 3  . tamsulosin (FLOMAX) 0.4 MG CAPS capsule Take 1 capsule (0.4 mg total) by mouth daily after supper. For urination. 90 capsule 2  . valsartan (DIOVAN) 160 MG tablet Take 160 mg by mouth daily.    . venlafaxine XR (EFFEXOR-XR) 150 MG 24 hr capsule Take 150 mg by mouth daily with breakfast.     No current facility-administered medications on file prior to visit.    BP 122/82   Pulse 72   Temp (!) 95.8 F (35.4 C) (Temporal)   Ht 5' 6" (1.676 m)   Wt 207 lb 8 oz (94.1 kg)   SpO2 97%   BMI 33.49 kg/m    Objective:   Physical Exam  Constitutional: He appears well-nourished.  Respiratory: Effort normal.  GI: Soft. Bowel sounds are normal. There is no abdominal tenderness.  Skin: Skin is warm and dry.           Assessment & Plan:   

## 2019-11-18 NOTE — Telephone Encounter (Signed)
Have him try Miralax once to twice daily for the next week. Have him update me Monday next week.

## 2019-11-18 NOTE — Patient Instructions (Signed)
Complete xray(s) prior to leaving today. I will notify you of your results once received.  Continue Miralax, increase to twice daily until you have a movement, then reduce to once daily until you regulate. Stop if you experience continued diarrhea.   Ensure you are consuming 64 ounces of water daily.  Be sure to exercise regularly.   Please update me next week. It was a pleasure to see you today!   Constipation, Adult Constipation is when a person has fewer bowel movements in a week than normal, has difficulty having a bowel movement, or has stools that are dry, hard, or larger than normal. Constipation may be caused by an underlying condition. It may become worse with age if a person takes certain medicines and does not take in enough fluids. Follow these instructions at home: Eating and drinking   Eat foods that have a lot of fiber, such as fresh fruits and vegetables, whole grains, and beans.  Limit foods that are high in fat, low in fiber, or overly processed, such as french fries, hamburgers, cookies, candies, and soda.  Drink enough fluid to keep your urine clear or pale yellow. General instructions  Exercise regularly or as told by your health care provider.  Go to the restroom when you have the urge to go. Do not hold it in.  Take over-the-counter and prescription medicines only as told by your health care provider. These include any fiber supplements.  Practice pelvic floor retraining exercises, such as deep breathing while relaxing the lower abdomen and pelvic floor relaxation during bowel movements.  Watch your condition for any changes.  Keep all follow-up visits as told by your health care provider. This is important. Contact a health care provider if:  You have pain that gets worse.  You have a fever.  You do not have a bowel movement after 4 days.  You vomit.  You are not hungry.  You lose weight.  You are bleeding from the anus.  You have thin,  pencil-like stools. Get help right away if:  You have a fever and your symptoms suddenly get worse.  You leak stool or have blood in your stool.  Your abdomen is bloated.  You have severe pain in your abdomen.  You feel dizzy or you faint. This information is not intended to replace advice given to you by your health care provider. Make sure you discuss any questions you have with your health care provider. Document Revised: 05/15/2017 Document Reviewed: 11/21/2015 Elsevier Patient Education  2020 [employer].

## 2019-12-13 ENCOUNTER — Other Ambulatory Visit: Payer: Self-pay

## 2019-12-13 ENCOUNTER — Ambulatory Visit (INDEPENDENT_AMBULATORY_CARE_PROVIDER_SITE_OTHER): Payer: Medicare Other | Admitting: Primary Care

## 2019-12-13 ENCOUNTER — Encounter: Payer: Self-pay | Admitting: Primary Care

## 2019-12-13 VITALS — BP 124/84 | HR 60 | Temp 95.4°F | Ht 66.0 in | Wt 204.5 lb

## 2019-12-13 DIAGNOSIS — E119 Type 2 diabetes mellitus without complications: Secondary | ICD-10-CM | POA: Diagnosis not present

## 2019-12-13 DIAGNOSIS — N400 Enlarged prostate without lower urinary tract symptoms: Secondary | ICD-10-CM | POA: Diagnosis not present

## 2019-12-13 DIAGNOSIS — F411 Generalized anxiety disorder: Secondary | ICD-10-CM

## 2019-12-13 DIAGNOSIS — J309 Allergic rhinitis, unspecified: Secondary | ICD-10-CM

## 2019-12-13 DIAGNOSIS — E785 Hyperlipidemia, unspecified: Secondary | ICD-10-CM

## 2019-12-13 DIAGNOSIS — I10 Essential (primary) hypertension: Secondary | ICD-10-CM

## 2019-12-13 LAB — POCT GLYCOSYLATED HEMOGLOBIN (HGB A1C): Hemoglobin A1C: 5.8 % — AB (ref 4.0–5.6)

## 2019-12-13 MED ORDER — VALSARTAN 160 MG PO TABS: 160.0000 mg | ORAL_TABLET | Freq: Every day | ORAL | 3 refills | Status: DC

## 2019-12-13 MED ORDER — TAMSULOSIN HCL 0.4 MG PO CAPS: 0.4000 mg | ORAL_CAPSULE | Freq: Every day | ORAL | 3 refills | Status: DC

## 2019-12-13 MED ORDER — VENLAFAXINE HCL ER 150 MG PO CP24: 150.0000 mg | ORAL_CAPSULE | Freq: Every day | ORAL | 3 refills | Status: DC

## 2019-12-13 MED ORDER — METFORMIN HCL ER 500 MG PO TB24: 1000.0000 mg | ORAL_TABLET | Freq: Every day | ORAL | 3 refills | Status: DC

## 2019-12-13 MED ORDER — EZETIMIBE 10 MG PO TABS: 10.0000 mg | ORAL_TABLET | Freq: Every day | ORAL | 3 refills | Status: DC

## 2019-12-13 MED ORDER — AMLODIPINE BESYLATE 5 MG PO TABS: 5.0000 mg | ORAL_TABLET | Freq: Every day | ORAL | 3 refills | Status: DC

## 2019-12-13 MED ORDER — FLUTICASONE FUROATE 27.5 MCG/SPRAY NA SUSP: 1.0000 | Freq: Every day | NASAL | 3 refills | Status: DC

## 2019-12-13 NOTE — Assessment & Plan Note (Signed)
Significant improvement in readings with a drop from 12.6 to 5.8!! Commended him on dietary changes, encouraged exercise.  Given such great readings and also considering age, we will stop Glipizide, continue Metformin XR 1000 daily. He will continue to monitor glucose levels and update if he starts to see an increase.   Managed on ARB, no statin. Pneumonia vaccination UTD. Eye exam due next month. Foot exam UTD.  Follow up in 6 months.

## 2019-12-13 NOTE — Progress Notes (Signed)
Subjective:    Patient ID: Johnathan Dominguez, male    DOB: Feb 15, 1951, 69 y.o.   MRN: [id number]  HPI  This visit occurred during the SARS-CoV-2 public health emergency.  Safety protocols were in place, including screening questions prior to the visit, additional usage of staff PPE, and extensive cleaning of exam room while observing appropriate contact time as indicated for disinfecting solutions.   [name] is a 69 year old male with a history of hypertension, type 2 diabetes, BPH, hyperlipidemia, GAD who presents today for follow up of diabetes.  He is also requesting to transfer all prescriptions to [facility] from [place].   Current medications include: Glipizide XL 10 mg and Metformin XR 1000 mg.  He is checking his blood glucose 1 times daily and is getting readings of:  AM fasting: high 90's to low 100's  Last A1C: 12.6 in March 2021, 5.8 today.  Last Eye Exam: UTD per patient, due next month. Last Foot Exam: UTD Pneumonia Vaccination: UTD per patient  ACE/ARB: Valsartan Statin: None. On Zetia  BP Readings from Last 3 Encounters:  12/13/19 124/84  11/18/19 122/82  10/13/19 124/82   Wt Readings from Last 3 Encounters:  12/13/19 204 lb 8 oz (92.8 kg)  11/18/19 207 lb 8 oz (94.1 kg)  10/13/19 212 lb (96.2 kg)     Review of Systems  Eyes: Negative for visual disturbance.  Respiratory: Negative for shortness of breath.   Cardiovascular: Negative for chest pain.  Neurological: Negative for dizziness and numbness.       Past Medical History:  Diagnosis Date  . Allergic rhinitis   . BPH (benign prostatic hyperplasia)   . Essential hypertension   . GAD (generalized anxiety disorder)   . Hyperlipidemia   . Osteoarthritis      Social History   Socioeconomic History  . Marital status: Married    Spouse name: Not on file  . Number of children: Not on file  . Years of education: Not on file  . Highest education level: Not on file  Occupational History  . Not  on file  Tobacco Use  . Smoking status: Never Smoker  . Smokeless tobacco: Never Used  Substance and Sexual Activity  . Alcohol use: Not Currently  . Drug use: Not on file  . Sexual activity: Not on file  Other Topics Concern  . Not on file  Social History Narrative  . Not on file   Social Determinants of Health   Financial Resource Strain:   . Difficulty of Paying Living Expenses:   Food Insecurity:   . Worried About Charity fundraiser in the Last Year:   . Arboriculturist in the Last Year:   Transportation Needs:   . Film/video editor (Medical):   Marland Kitchen Lack of Transportation (Non-Medical):   Physical Activity:   . Days of Exercise per Week:   . Minutes of Exercise per Session:   Stress:   . Feeling of Stress :   Social Connections:   . Frequency of Communication with Friends and Family:   . Frequency of Social Gatherings with Friends and Family:   . Attends Religious Services:   . Active Member of Clubs or Organizations:   . Attends Archivist Meetings:   Marland Kitchen Marital Status:   Intimate Partner Violence:   . Fear of Current or Ex-Partner:   . Emotionally Abused:   Marland Kitchen Physically Abused:   . Sexually Abused:  Past Surgical History:  Procedure Laterality Date  . CERVICAL SPINE SURGERY    . [place]  . HERNIA REPAIR    . TONSILLECTOMY AND ADENOIDECTOMY      Family History  Problem Relation Age of Onset  . Heart disease Mother   . Hypertension Mother   . Heart disease Maternal Grandfather     Allergies  Allergen Reactions  . Statins     Current Outpatient Medications on File Prior to Visit  Medication Sig Dispense Refill  . blood glucose meter kit and supplies KIT Use up to four times daily as directed. (FOR ICD-9 250.00, 250.01). 1 each 0  . glipiZIDE (GLUCOTROL XL) 10 MG 24 hr tablet Take 1 tablet (10 mg total) by mouth daily with breakfast. For diabetes. 90 tablet 3  . glucose blood (ONETOUCH ULTRA) test strip Check sugar  2-3 times daily DX E11.9 100 each 5  . meloxicam (MOBIC) 15 MG tablet Take 15 mg by mouth daily.    . metFORMIN (GLUCOPHAGE-XR) 500 MG 24 hr tablet Take 2 tablets (1,000 mg total) by mouth daily with breakfast. For diabetes. 180 tablet 3   No current facility-administered medications on file prior to visit.    BP 124/84   Pulse 60   Temp (!) 95.4 F (35.2 C) (Temporal)   Ht 5' 6"  (1.676 m)   Wt 204 lb 8 oz (92.8 kg)   SpO2 97%   BMI 33.01 kg/m    Objective:   Physical Exam Cardiovascular:     Rate and Rhythm: Normal rate and regular rhythm.  Pulmonary:     Effort: Pulmonary effort is normal.     Breath sounds: Normal breath sounds.  Musculoskeletal:     Cervical back: Neck supple.  Skin:    General: Skin is warm and dry.  Psychiatric:        Mood and Affect: Mood normal.            Assessment & Plan:

## 2019-12-13 NOTE — Patient Instructions (Signed)
Stop taking Glipizide XL 10 mg for diabetes.  Continue taking Metformin XR 500 mg, 2 tablets daily, for diabetes.  Start exercising. You should be getting 150 minutes of moderate intensity exercise weekly.  Continue to work on a healthy diet. Ensure you are consuming 64 ounces of water daily.  Please schedule a follow up appointment in 6 months for diabetes check.   It was a pleasure to see you today!   Diabetes Mellitus and Nutrition, Adult When you have diabetes (diabetes mellitus), it is very important to have healthy eating habits because your blood sugar (glucose) levels are greatly affected by what you eat and drink. Eating healthy foods in the appropriate amounts, at about the same times every day, can help you:  Control your blood glucose.  Lower your risk of heart disease.  Improve your blood pressure.  Reach or maintain a healthy weight. Every person with diabetes is different, and each person has different needs for a meal plan. Your health care provider may recommend that you work with a diet and nutrition specialist (dietitian) to make a meal plan that is best for you. Your meal plan may vary depending on factors such as:  The calories you need.  The medicines you take.  Your weight.  Your blood glucose, blood pressure, and cholesterol levels.  Your activity level.  Other health conditions you have, such as heart or kidney disease. How do carbohydrates affect me? Carbohydrates, also called carbs, affect your blood glucose level more than any other type of food. Eating carbs naturally raises the amount of glucose in your blood. Carb counting is a method for keeping track of how many carbs you eat. Counting carbs is important to keep your blood glucose at a healthy level, especially if you use insulin or take certain oral diabetes medicines. It is important to know how many carbs you can safely have in each meal. This is different for every person. Your dietitian can  help you calculate how many carbs you should have at each meal and for each snack. Foods that contain carbs include:  Bread, cereal, rice, pasta, and crackers.  Potatoes and corn.  Peas, beans, and lentils.  Milk and yogurt.  Fruit and juice.  Desserts, such as cakes, cookies, ice cream, and candy. How does alcohol affect me? Alcohol can cause a sudden decrease in blood glucose (hypoglycemia), especially if you use insulin or take certain oral diabetes medicines. Hypoglycemia can be a life-threatening condition. Symptoms of hypoglycemia (sleepiness, dizziness, and confusion) are similar to symptoms of having too much alcohol. If your health care provider says that alcohol is safe for you, follow these guidelines:  Limit alcohol intake to no more than 1 drink per day for nonpregnant women and 2 drinks per day for men. One drink equals 12 oz of beer, 5 oz of wine, or 1 oz of hard liquor.  Do not drink on an empty stomach.  Keep yourself hydrated with water, diet soda, or unsweetened iced tea.  Keep in mind that regular soda, juice, and other mixers may contain a lot of sugar and must be counted as carbs. What are tips for following this plan?  Reading food labels  Start by checking the serving size on the "Nutrition Facts" label of packaged foods and drinks. The amount of calories, carbs, fats, and other nutrients listed on the label is based on one serving of the item. Many items contain more than one serving per package.  Check the total grams (  g) of carbs in one serving. You can calculate the number of servings of carbs in one serving by dividing the total carbs by 15. For example, if a food has 30 g of total carbs, it would be equal to 2 servings of carbs.  Check the number of grams (g) of saturated and trans fats in one serving. Choose foods that have low or no amount of these fats.  Check the number of milligrams (mg) of salt (sodium) in one serving. Most people should limit  total sodium intake to less than 2,300 mg per day.  Always check the nutrition information of foods labeled as "low-fat" or "nonfat". These foods may be higher in added sugar or refined carbs and should be avoided.  Talk to your dietitian to identify your daily goals for nutrients listed on the label. Shopping  Avoid buying canned, premade, or processed foods. These foods tend to be high in fat, sodium, and added sugar.  Shop around the outside edge of the grocery store. This includes fresh fruits and vegetables, bulk grains, fresh meats, and fresh dairy. Cooking  Use low-heat cooking methods, such as baking, instead of high-heat cooking methods like deep frying.  Cook using healthy oils, such as olive, canola, or sunflower oil.  Avoid cooking with butter, cream, or high-fat meats. Meal planning  Eat meals and snacks regularly, preferably at the same times every day. Avoid going long periods of time without eating.  Eat foods high in fiber, such as fresh fruits, vegetables, beans, and whole grains. Talk to your dietitian about how many servings of carbs you can eat at each meal.  Eat 4-6 ounces (oz) of lean protein each day, such as lean meat, chicken, fish, eggs, or tofu. One oz of lean protein is equal to: ? 1 oz of meat, chicken, or fish. ? 1 egg. ?  cup of tofu.  Eat some foods each day that contain healthy fats, such as avocado, nuts, seeds, and fish. Lifestyle  Check your blood glucose regularly.  Exercise regularly as told by your health care provider. This may include: ? 150 minutes of moderate-intensity or vigorous-intensity exercise each week. This could be brisk walking, biking, or water aerobics. ? Stretching and doing strength exercises, such as yoga or weightlifting, at least 2 times a week.  Take medicines as told by your health care provider.  Do not use any products that contain nicotine or tobacco, such as cigarettes and e-cigarettes. If you need help  quitting, ask your health care provider.  Work with a Social worker or diabetes educator to identify strategies to manage stress and any emotional and social challenges. Questions to ask a health care provider  Do I need to meet with a diabetes educator?  Do I need to meet with a dietitian?  What number can I call if I have questions?  When are the best times to check my blood glucose? Where to find more information:  American Diabetes Association: diabetes.org  Academy of Nutrition and Dietetics: www.eatright.[employer] of Diabetes and Digestive and Kidney Diseases (NIH): [employer] Summary  A healthy meal plan will help you control your blood glucose and maintain a healthy lifestyle.  Working with a diet and nutrition specialist (dietitian) can help you make a meal plan that is best for you.  Keep in mind that carbohydrates (carbs) and alcohol have immediate effects on your blood glucose levels. It is important to count carbs and to use alcohol carefully. This information is not intended  to replace advice given to you by your health care provider. Make sure you discuss any questions you have with your health care provider. Document Revised: 05/15/2017 Document Reviewed: 07/07/2016 Elsevier Patient Education  2020 [employer].

## 2020-01-12 ENCOUNTER — Other Ambulatory Visit: Payer: Self-pay | Admitting: Primary Care

## 2020-01-12 MED ORDER — ONETOUCH DELICA LANCING DEV MISC: 5 refills | Status: DC

## 2020-01-16 LAB — HM DIABETES EYE EXAM

## 2020-01-23 ENCOUNTER — Ambulatory Visit (INDEPENDENT_AMBULATORY_CARE_PROVIDER_SITE_OTHER): Payer: Medicare Other | Admitting: Dermatology

## 2020-01-23 ENCOUNTER — Other Ambulatory Visit: Payer: Self-pay

## 2020-01-23 DIAGNOSIS — L720 Epidermal cyst: Secondary | ICD-10-CM | POA: Diagnosis not present

## 2020-01-23 DIAGNOSIS — L578 Other skin changes due to chronic exposure to nonionizing radiation: Secondary | ICD-10-CM

## 2020-01-23 DIAGNOSIS — L821 Other seborrheic keratosis: Secondary | ICD-10-CM

## 2020-01-23 DIAGNOSIS — L814 Other melanin hyperpigmentation: Secondary | ICD-10-CM | POA: Diagnosis not present

## 2020-01-23 DIAGNOSIS — Z1283 Encounter for screening for malignant neoplasm of skin: Secondary | ICD-10-CM

## 2020-01-23 DIAGNOSIS — L570 Actinic keratosis: Secondary | ICD-10-CM

## 2020-01-23 DIAGNOSIS — D1800 Hemangioma unspecified site: Secondary | ICD-10-CM

## 2020-01-23 DIAGNOSIS — D229 Melanocytic nevi, unspecified: Secondary | ICD-10-CM

## 2020-01-23 DIAGNOSIS — L820 Inflamed seborrheic keratosis: Secondary | ICD-10-CM

## 2020-01-23 NOTE — Progress Notes (Signed)
New Patient Visit  Subjective  Johnathan Dominguez is a 69 y.o. male who presents for the following: Annual Exam (New pt here for TBSE, no history of skin cancer ). The patient presents for Total-Body Skin Exam (TBSE) for skin cancer screening and mole check.  The following portions of the chart were reviewed this encounter and updated as appropriate:  Tobacco  Allergies  Meds  Problems  Med Hx  Surg Hx  Fam Hx     Review of Systems:  No other skin or systemic complaints except as noted in HPI or Assessment and Plan.  Objective  Well appearing patient in no apparent distress; mood and affect are within normal limits.  A full examination was performed including scalp, head, eyes, ears, nose, lips, neck, chest, axillae, abdomen, back, buttocks, bilateral upper extremities, bilateral lower extremities, hands, feet, fingers, toes, fingernails, and toenails. All findings within normal limits unless otherwise noted below.  Objective  Right Forearm: Erythematous keratotic or waxy stuck-on papule or plaque.   Objective  scalp, face, ears (18): Erythematous thin papules/macules with gritty scale.   Objective  R scalp: 1.0 cm Subcutaneous nodule.    Assessment & Plan  Inflamed seborrheic keratosis Right Forearm  Destruction of lesion - Right Forearm Complexity: simple   Destruction method: cryotherapy   Informed consent: discussed and consent obtained   Timeout:  patient name, date of birth, surgical site, and procedure verified Lesion destroyed using liquid nitrogen: Yes   Region frozen until ice ball extended beyond lesion: Yes   Outcome: patient tolerated procedure well with no complications   Post-procedure details: wound care instructions given    AK (actinic keratosis) scalp, face, ears x 18  AK vs [facility] at Right helix, Left helix treating with Ln2 today, recheck in 2 months. Destruction with LN2 x 18 after consent.   May consider PDT or topical treatment in the  future   Destruction of lesion - scalp, face, ears Complexity: simple   Destruction method: cryotherapy   Informed consent: discussed and consent obtained   Timeout:  patient name, date of birth, surgical site, and procedure verified Lesion destroyed using liquid nitrogen: Yes   Region frozen until ice ball extended beyond lesion: Yes   Outcome: patient tolerated procedure well with no complications   Post-procedure details: wound care instructions given    Epidermal inclusion cyst R scalp Discussed surgical option. Observe  Lentigines - Scattered tan macules - Discussed due to sun exposure - Benign, observe - Call for any changes  Seborrheic Keratoses - Stuck-on, waxy, tan-brown papules and plaques  - Discussed benign etiology and prognosis. - Observe - Call for any changes  Melanocytic Nevi - Tan-brown and/or pink-flesh-colored symmetric macules and papules - Benign appearing on exam today - Observation - Call clinic for new or changing moles - Recommend daily use of broad spectrum spf 30+ sunscreen to sun-exposed areas.   Hemangiomas - Red papules - Discussed benign nature - Observe - Call for any changes  Actinic Damage - diffuse scaly erythematous macules with underlying dyspigmentation - Recommend daily broad spectrum sunscreen SPF 30+ to sun-exposed areas, reapply every 2 hours as needed.  - Call for new or changing lesions.  Skin cancer screening performed today.  Return in about 2 months (around 03/24/2020) for Recheck Aks .  IMarye Round, CMA, am acting as scribe for Sarina Ser, MD .  Documentation: I have reviewed the above documentation for accuracy and completeness, and I agree with the above.  Sarina Ser, MD

## 2020-01-23 NOTE — Patient Instructions (Addendum)

## 2020-01-26 ENCOUNTER — Encounter: Payer: Self-pay | Admitting: Dermatology

## 2020-03-21 ENCOUNTER — Ambulatory Visit (INDEPENDENT_AMBULATORY_CARE_PROVIDER_SITE_OTHER): Payer: Medicare Other | Admitting: Dermatology

## 2020-03-21 ENCOUNTER — Other Ambulatory Visit: Payer: Self-pay

## 2020-03-21 DIAGNOSIS — L578 Other skin changes due to chronic exposure to nonionizing radiation: Secondary | ICD-10-CM | POA: Diagnosis not present

## 2020-03-21 DIAGNOSIS — L821 Other seborrheic keratosis: Secondary | ICD-10-CM

## 2020-03-21 DIAGNOSIS — L570 Actinic keratosis: Secondary | ICD-10-CM | POA: Diagnosis not present

## 2020-03-21 DIAGNOSIS — L820 Inflamed seborrheic keratosis: Secondary | ICD-10-CM | POA: Diagnosis not present

## 2020-03-21 DIAGNOSIS — D692 Other nonthrombocytopenic purpura: Secondary | ICD-10-CM

## 2020-03-21 NOTE — Progress Notes (Signed)
   Follow-Up Visit   Subjective  Johnathan Dominguez is a 69 y.o. male who presents for the following: Actinic Keratosis (face, scalp and ears 55m f/u) and ISK f/u (R forearm, 69m f/u).  The following portions of the chart were reviewed this encounter and updated as appropriate:  Tobacco  Allergies  Meds  Problems  Med Hx  Surg Hx  Fam Hx     Review of Systems:  No other skin or systemic complaints except as noted in HPI or Assessment and Plan.  Objective  Well appearing patient in no apparent distress; mood and affect are within normal limits.  A focused examination was performed including face, scalp, ears, arms. Relevant physical exam findings are noted in the Assessment and Plan.  Objective  scalp/face x 18 (18): Pink scaly macules   Objective  scalp x 2 (2): Erythematous keratotic or waxy stuck-on papule or plaque.    Assessment & Plan  AK (actinic keratosis) (18) scalp/face x 18  Plan PDT to scalp in 4 weeks, and PDT to face 2-4 weeks after scalp.  Info on PDT given to pt.  Destruction of lesion - scalp/face x 18 Complexity: simple   Destruction method: cryotherapy   Informed consent: discussed and consent obtained   Timeout:  patient name, date of birth, surgical site, and procedure verified Lesion destroyed using liquid nitrogen: Yes   Region frozen until ice ball extended beyond lesion: Yes   Outcome: patient tolerated procedure well with no complications   Post-procedure details: wound care instructions given    Inflamed seborrheic keratosis (2) scalp x 2  Destruction of lesion - scalp x 2 Complexity: simple   Destruction method: cryotherapy   Informed consent: discussed and consent obtained   Timeout:  patient name, date of birth, surgical site, and procedure verified Lesion destroyed using liquid nitrogen: Yes   Region frozen until ice ball extended beyond lesion: Yes   Outcome: patient tolerated procedure well with no complications     Post-procedure details: wound care instructions given     Actinic Damage - diffuse scaly erythematous macules with underlying dyspigmentation - Recommend daily broad spectrum sunscreen SPF 30+ to sun-exposed areas, reapply every 2 hours as needed.  - Call for new or changing lesions.  Seborrheic Keratoses - Stuck-on, waxy, tan-brown papules and plaques  - Discussed benign etiology and prognosis. - Observe - Call for any changes  Purpura - Violaceous macules and patches - Benign - Related to age, sun damage and/or use of blood thinners - Observe - Can use OTC arnica containing moisturizer such as Dermend Bruise Formula if desired - Call for worsening or other concerns   Return in about 1 month (around 04/21/2020) for PDT scalp, 2-4 weeks PDT face, see Dr. Raliegh Ip 20m.   I, Othelia Pulling, RMA, am acting as scribe for Sarina Ser, MD .  Documentation: I have reviewed the above documentation for accuracy and completeness, and I agree with the above.  Sarina Ser, MD

## 2020-03-23 ENCOUNTER — Encounter: Payer: Self-pay | Admitting: Dermatology

## 2020-03-27 ENCOUNTER — Other Ambulatory Visit: Payer: Self-pay

## 2020-03-27 ENCOUNTER — Ambulatory Visit (INDEPENDENT_AMBULATORY_CARE_PROVIDER_SITE_OTHER): Payer: Medicare Other

## 2020-03-27 ENCOUNTER — Ambulatory Visit (INDEPENDENT_AMBULATORY_CARE_PROVIDER_SITE_OTHER): Payer: Medicare Other | Admitting: Podiatry

## 2020-03-27 DIAGNOSIS — M722 Plantar fascial fibromatosis: Secondary | ICD-10-CM | POA: Diagnosis not present

## 2020-03-27 MED ORDER — METHYLPREDNISOLONE 4 MG PO TBPK
ORAL_TABLET | ORAL | 0 refills | Status: DC
Start: 2020-03-27 — End: 2020-06-06

## 2020-03-27 NOTE — Progress Notes (Signed)
   Subjective: 69 y.o. male presenting as a new patient for evaluation of right heel pain is been going on for the last few years.  He has not done anything for treatment other than changing his shoe gear and he currently wears [employer] shoes.  He denies a history of injury to the area.  He presents for further treatment evaluation   Past Medical History:  Diagnosis Date  . Allergic rhinitis   . BPH (benign prostatic hyperplasia)   . Essential hypertension   . GAD (generalized anxiety disorder)   . Hyperlipidemia   . Osteoarthritis      Objective: Physical Exam General: The patient is alert and oriented x3 in no acute distress.  Dermatology: Skin is warm, dry and supple bilateral lower extremities. Negative for open lesions or macerations bilateral.   Vascular: Dorsalis Pedis and Posterior Tibial pulses palpable bilateral.  Capillary fill time is immediate to all digits.  Neurological: Epicritic and protective threshold intact bilateral.   Musculoskeletal: Tenderness to palpation to the plantar aspect of the right heel along the plantar fascia. All other joints range of motion within normal limits bilateral. Strength 5/5 in all groups bilateral.   Radiographic exam: Normal osseous mineralization. Joint spaces preserved. No fracture/dislocation/boney destruction. No other soft tissue abnormalities or radiopaque foreign bodies.   Assessment: 1. Plantar fasciitis right  Plan of Care:  1. Patient evaluated. Xrays reviewed.   2. Injection of 0.5cc Celestone soluspan injected into the right plantar fascia  3. Rx for Medrol Dose Pack placed 4.  After completion of the Dosepak resume meloxicam 15 mg daily from PCP 5. Plantar fascial band(s) dispensed 6. Instructed patient regarding therapies and modalities at home to alleviate symptoms.  7. Return to clinic in 4 weeks.     Edrick Kins, DPM [facility]  Dr. Edrick Kins, DPM    [address], [place] [address]                Office [phone]  Fax [phone]

## 2020-04-19 ENCOUNTER — Other Ambulatory Visit: Payer: Self-pay

## 2020-04-19 ENCOUNTER — Ambulatory Visit (INDEPENDENT_AMBULATORY_CARE_PROVIDER_SITE_OTHER): Payer: Medicare Other

## 2020-04-19 DIAGNOSIS — L570 Actinic keratosis: Secondary | ICD-10-CM

## 2020-04-19 MED ORDER — AMINOLEVULINIC ACID HCL 20 % EX SOLR
1.0000 "application " | Freq: Once | CUTANEOUS | Status: AC
  Administered 2020-04-19: 354 mg via TOPICAL

## 2020-04-19 NOTE — Progress Notes (Signed)
Patient completed PDT therapy today.  1. AK (actinic keratosis) Scalp  Photodynamic therapy - Scalp Procedure discussed: discussed risks, benefits, side effects. and alternatives   Prep: site scrubbed/prepped with acetone   Location:  Scalp Number of lesions:  Multiple Type of treatment:  Blue light Aminolevulinic Acid (see MAR for details): Levulan Number of Levulan sticks used:  1 Incubation time (minutes):  120 Number of minutes under lamp:  16 Number of seconds under lamp:  40 Cooling:  Floor fan Outcome: patient tolerated procedure well with no complications   Post-procedure details: sunscreen applied    Aminolevulinic Acid HCl 20 % SOLR 354 mg - Scalp    

## 2020-04-19 NOTE — Patient Instructions (Signed)

## 2020-04-27 ENCOUNTER — Ambulatory Visit (INDEPENDENT_AMBULATORY_CARE_PROVIDER_SITE_OTHER): Payer: Medicare Other | Admitting: Podiatry

## 2020-04-27 ENCOUNTER — Other Ambulatory Visit: Payer: Self-pay

## 2020-04-27 ENCOUNTER — Encounter: Payer: Self-pay | Admitting: Podiatry

## 2020-04-27 DIAGNOSIS — M722 Plantar fascial fibromatosis: Secondary | ICD-10-CM

## 2020-04-27 NOTE — Progress Notes (Signed)
   Subjective: 69 y.o. male presenting for follow-up evaluation of plantar fasciitis to the right foot.  Patient states that the injections helped for approximately 2-3 weeks.  He completed the Medrol Dosepak and continues to take meloxicam daily from his PCP for knee pain.  He presents for follow-up treatment evaluation  Past Medical History:  Diagnosis Date  . Allergic rhinitis   . BPH (benign prostatic hyperplasia)   . Essential hypertension   . GAD (generalized anxiety disorder)   . Hyperlipidemia   . Osteoarthritis      Objective: Physical Exam General: The patient is alert and oriented x3 in no acute distress.  Dermatology: Skin is warm, dry and supple bilateral lower extremities. Negative for open lesions or macerations bilateral.   Vascular: Dorsalis Pedis and Posterior Tibial pulses palpable bilateral.  Capillary fill time is immediate to all digits.  Neurological: Epicritic and protective threshold intact bilateral.   Musculoskeletal: Tenderness to palpation to the plantar aspect of the right heel along the plantar fascia. All other joints range of motion within normal limits bilateral. Strength 5/5 in all groups bilateral.   Assessment: 1. Plantar fasciitis right  Plan of Care:  1. Patient evaluated.  2. Injection of 0.5cc Celestone soluspan injected into the right plantar fascia  3.  Continue meloxicam daily from PCP 4.  Patient states that the plantar fascial braces did not help alleviate his symptoms.  OTC power step insoles were provided today.  Wear daily.  With his [employer]. 5.  Return to clinic in 4 weeks   Edrick Kins, DPM [facility]  Dr. Edrick Kins, DPM    [address], [place] [address]                Office [phone]  Fax [phone]

## 2020-05-17 ENCOUNTER — Ambulatory Visit: Payer: Medicare Other

## 2020-05-29 ENCOUNTER — Encounter: Payer: Self-pay | Admitting: Podiatry

## 2020-05-29 ENCOUNTER — Ambulatory Visit (INDEPENDENT_AMBULATORY_CARE_PROVIDER_SITE_OTHER): Payer: Medicare Other | Admitting: Podiatry

## 2020-05-29 ENCOUNTER — Other Ambulatory Visit: Payer: Self-pay

## 2020-05-29 DIAGNOSIS — M722 Plantar fascial fibromatosis: Secondary | ICD-10-CM | POA: Diagnosis not present

## 2020-05-29 NOTE — Progress Notes (Signed)
   HPI: 69 y.o. male presenting today for follow-up evaluation of plantar fasciitis to the right foot.  Patient states he is 98% improved.  He only experiences some slight sensitivity every now and then.  Otherwise he is very happy that he is now able to walk without pain.  He has been taking the meloxicam from his PCP and wearing the OTC insoles in his [employer] shoes  Past Medical History:  Diagnosis Date  . Allergic rhinitis   . BPH (benign prostatic hyperplasia)   . Essential hypertension   . GAD (generalized anxiety disorder)   . Hyperlipidemia   . Osteoarthritis      Physical Exam: General: The patient is alert and oriented x3 in no acute distress.  Dermatology: Skin is warm, dry and supple bilateral lower extremities. Negative for open lesions or macerations.  Vascular: Palpable pedal pulses bilaterally. No edema or erythema noted. Capillary refill within normal limits.    Neurological: Epicritic and protective threshold grossly intact bilaterally.   Musculoskeletal Exam: Range of motion within normal limits to all pedal and ankle joints bilateral. Muscle strength 5/5 in all groups bilateral. There is really no sensitivity or pain with palpation along the plantar fascia of the right heel  Assessment: 1.  Plantar fasciitis right-resolved   Plan of Care:  1. Patient evaluated.  2.  Continue wearing OTC power step insoles 3.  Continue meloxicam 15 mg daily from his PCP as needed 4.  Return to clinic as needed      Edrick Kins, DPM [facility]  Dr. Edrick Kins, DPM    [address], [place] [address]                Office [phone]  Fax [phone]

## 2020-05-30 ENCOUNTER — Ambulatory Visit: Payer: Medicare Other

## 2020-06-06 ENCOUNTER — Encounter: Payer: Self-pay | Admitting: Primary Care

## 2020-06-06 ENCOUNTER — Telehealth (INDEPENDENT_AMBULATORY_CARE_PROVIDER_SITE_OTHER): Payer: Medicare Other | Admitting: Primary Care

## 2020-06-06 VITALS — Ht 66.0 in | Wt 204.0 lb

## 2020-06-06 DIAGNOSIS — J3489 Other specified disorders of nose and nasal sinuses: Secondary | ICD-10-CM | POA: Insufficient documentation

## 2020-06-06 DIAGNOSIS — E119 Type 2 diabetes mellitus without complications: Secondary | ICD-10-CM | POA: Diagnosis not present

## 2020-06-06 LAB — POCT GLYCOSYLATED HEMOGLOBIN (HGB A1C): Hemoglobin A1C: 6.2 % — AB (ref 4.0–5.6)

## 2020-06-06 NOTE — Progress Notes (Signed)
Subjective:    Patient ID: Johnathan Dominguez, male    DOB: Sep 03, 1950, 69 y.o.   MRN: [id number]  HPI  This visit occurred during the SARS-CoV-2 public health emergency.  Safety protocols were in place, including screening questions prior to the visit, additional usage of staff PPE, and extensive cleaning of exam room while observing appropriate contact time as indicated for disinfecting solutions.      Johnathan Dominguez - 69 y.o. male  MRN [id number]  Date of Birth: 1951/03/05  PCP: Pleas Koch, NP  This service was provided via telemedicine. Phone Visit performed on 06/06/2020    Rationale for phone visit along with limitations reviewed. I discussed the limitations, risks, security and privacy concerns of performing a phone visit and the availability of in person appointments. I also discussed with the patient that there may be a patient responsible charge related to this service. Patient consented to telephone encounter.    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 Participants: Patient and myself Name of referring provider: N/A   Names of persons and role in encounter: Provider: Pleas Koch, NP  Patient: Johnathan Dominguez  Other: N/A   Time on call: 11 min - 30 sec   Subjective: Chief Complaint  Patient presents with  . Facial Pain    X 3 weeks no fever with cough that is productive and green.      HPI:  Mr. Johnathan Dominguez is a 69 year old male with a history of diabetes, hypertension, hyperlipidemia who presents today for follow up of diabetes and a chief complaint of sinus pressure.   He initially came into our office, but due to sinus symptoms he was told to wait in his car. He could not connect to video for a visit.  1) Type 2 Diabetes:   Current medications include: Metformin XR 1000 mg daily. Stopped Glipizide last visit due to improvement in A1C but he has been taking Glipizide 2-3 times weekly  sometimes with glucose gets "too high".  He is checking his blood glucose 1 times daily and is getting readings of:  AM fasting: 120's-130's mostly. He will take a glipizide when he sees readings in the 150's.   Last A1C: 5.8 in June 2021 Last Eye Exam: UTD Last Foot Exam: UTD Pneumonia Vaccination: UTD per patient. ACE/ARB: Valsartan Statin: None. Zetia. Intolerant to statin therapy.  2) Sinus Pressure: Acute for the last 3 weeks with expulsion of thick green mucous from his nasal cavity, headaches, right ear pain and pressure, cough. He's been taking Mucinex, Zyrtec, and using fluticasone nasal spray with improvement. History of sinusitis, last infection was about one year ago. Over the last several days he's noticed improvement in his symptoms, now is seeing clear mucous from nasal cavity and is feeling much better.    Objective/Observations:  Alert and oriented. Appears well, not sickly. No distress. Speaking in complete sentences. No cough  No physical exam or vital signs collected unless specifically identified below.   Ht 5\' 6"  (1.676 m)   Wt 204 lb (92.5 kg)   BMI 32.93 kg/m    Respiratory status: speaks in complete sentences without evident shortness of breath.   Assessment/Plan:  See problem based charting.  No problem-specific Assessment & Plan notes found for this encounter.   I discussed the assessment and treatment plan with the patient. The patient was provided an opportunity to ask questions and all were answered. The patient  agreed with the plan and demonstrated an understanding of the instructions.  Lab Orders  No laboratory test(s) ordered today    No orders of the defined types were placed in this encounter.   The patient was advised to call back or seek an in-person evaluation if the symptoms worsen or if the condition fails to improve as anticipated.  Pleas Koch, NP    Review of Systems  Constitutional: Negative for chills and  fever.  HENT: Positive for congestion and postnasal drip. Negative for sinus pressure and sinus pain.   Eyes: Negative for visual disturbance.  Respiratory: Negative for cough.   Neurological: Negative for dizziness.       Past Medical History:  Diagnosis Date  . Allergic rhinitis   . BPH (benign prostatic hyperplasia)   . Essential hypertension   . GAD (generalized anxiety disorder)   . Hyperlipidemia   . Osteoarthritis      Social History   Socioeconomic History  . Marital status: Married    Spouse name: Not on file  . Number of children: Not on file  . Years of education: Not on file  . Highest education level: Not on file  Occupational History  . Not on file  Tobacco Use  . Smoking status: Never Smoker  . Smokeless tobacco: Never Used  Substance and Sexual Activity  . Alcohol use: Not Currently  . Drug use: Not on file  . Sexual activity: Not on file  Other Topics Concern  . Not on file  Social History Narrative  . Not on file   Social Determinants of Health   Financial Resource Strain: Not on file  Food Insecurity: Not on file  Transportation Needs: Not on file  Physical Activity: Not on file  Stress: Not on file  Social Connections: Not on file  Intimate Partner Violence: Not on file    Past Surgical History:  Procedure Laterality Date  . CERVICAL SPINE SURGERY    . [place]  . HERNIA REPAIR    . TONSILLECTOMY AND ADENOIDECTOMY      Family History  Problem Relation Age of Onset  . Heart disease Mother   . Hypertension Mother   . Heart disease Maternal Grandfather     Allergies  Allergen Reactions  . Statins     Current Outpatient Medications on File Prior to Visit  Medication Sig Dispense Refill  . amLODipine (NORVASC) 5 MG tablet Take 1 tablet (5 mg total) by mouth daily. For blood pressure. 90 tablet 3  . ezetimibe (ZETIA) 10 MG tablet Take 1 tablet (10 mg total) by mouth daily. For cholesterol. 90 tablet 3  .  fluticasone (VERAMYST) 27.5 MCG/SPRAY nasal spray Place 1 spray into the nose daily. 10 g 3  . meloxicam (MOBIC) 15 MG tablet Take 15 mg by mouth daily.    . metFORMIN (GLUCOPHAGE-XR) 500 MG 24 hr tablet Take 2 tablets (1,000 mg total) by mouth daily with breakfast. For diabetes. 180 tablet 3  . tamsulosin (FLOMAX) 0.4 MG CAPS capsule Take 1 capsule (0.4 mg total) by mouth daily after supper. For urination. 90 capsule 3  . valsartan (DIOVAN) 160 MG tablet Take 1 tablet (160 mg total) by mouth daily. For blood pressure. 90 tablet 3  . venlafaxine XR (EFFEXOR-XR) 150 MG 24 hr capsule Take 1 capsule (150 mg total) by mouth daily with breakfast. For anxiety. 90 capsule 3   No current facility-administered medications on file prior to visit.  Ht 5\' 6"  (1.676 m)   Wt 204 lb (92.5 kg)   BMI 32.93 kg/m    Objective:   Physical Exam Constitutional:      General: He is not in acute distress. Pulmonary:     Effort: Pulmonary effort is normal.     Comments: No cough during visit Neurological:     Mental Status: He is alert and oriented to person, place, and time.  Psychiatric:        Mood and Affect: Mood normal.            Assessment & Plan:

## 2020-06-06 NOTE — Assessment & Plan Note (Signed)
Stable today with A1C of 6.2, he is still taking Glipizide a few times weekly on occasion.  Given his age and his well controlled diabetes, recommended he stop Glipizide completely, continue Metformin XR 1000 mg daily, and update if glucose readings start increasing at or above 150 consistently. He agrees.  Follow up in 6 months.

## 2020-06-06 NOTE — Assessment & Plan Note (Signed)
Acute for three weeks, improved over the last 2-3 days. He sounds well today via phone.  We agreed to hold off on antibiotic treatment as he's improving, but he will call back if he's noticing a return in symptoms. At that point we will consider Augmentin.

## 2020-06-06 NOTE — Patient Instructions (Signed)
Stop taking Glipizide for diabetes.  Continue Metformin XR 1000 mg every morning.  Please notify me if your blood sugars are running 150 or higher consistently as discussed.  Please notify me if your sinus symptoms return.  Please schedule a follow up appointment in 6 months.   It was a pleasure to see you today!

## 2020-06-12 ENCOUNTER — Ambulatory Visit: Payer: Medicare Other

## 2020-06-12 ENCOUNTER — Telehealth: Payer: Self-pay | Admitting: Primary Care

## 2020-06-12 MED ORDER — AMOXICILLIN-POT CLAVULANATE 875-125 MG PO TABS: 1.0000 | ORAL_TABLET | Freq: Two times a day (BID) | ORAL | 0 refills | Status: DC

## 2020-06-12 NOTE — Telephone Encounter (Signed)
Pt wife called in wanted to let Jae Dire know that her husband wants to start the medication due to he was told to call back if the problems didn't go anywhere.

## 2020-06-12 NOTE — Telephone Encounter (Signed)
And the pharmacy is [facility] on [address].

## 2020-06-12 NOTE — Addendum Note (Signed)
Addended by: Lorre Munroe on: 06/12/2020 10:07 AM   Modules accepted: Orders

## 2020-06-12 NOTE — Telephone Encounter (Signed)
Called patient reviewed all information and repeated back to me. Will call if any questions. Will start meds and let our office know if no improvement or new symptoms.

## 2020-06-12 NOTE — Telephone Encounter (Signed)
Per Isabella Stalling note on 12/22 if no improvement consider augmentin. Have called patient no new symptoms but no improvement either. Having productive clear cough with headache and right ear pain. Would like to start abx due to no change in symptoms. Do I need to set up for another virtual appointment or ok to call in meds?

## 2020-06-12 NOTE — Telephone Encounter (Signed)
augmentin sent to pharmacy

## 2020-06-13 ENCOUNTER — Other Ambulatory Visit: Payer: Self-pay | Admitting: Family Medicine

## 2020-06-13 ENCOUNTER — Other Ambulatory Visit: Payer: Medicare Other

## 2020-06-13 DIAGNOSIS — Z20822 Contact with and (suspected) exposure to covid-19: Secondary | ICD-10-CM

## 2020-06-13 NOTE — Progress Notes (Signed)
Close contact COVID-19 contact with his daughter.  Greater than 1 hour with her coughing many times.

## 2020-06-14 ENCOUNTER — Ambulatory Visit: Payer: Medicare Other | Admitting: Primary Care

## 2020-06-14 LAB — SPECIMEN STATUS REPORT

## 2020-06-14 LAB — SARS-COV-2, NAA 2 DAY TAT

## 2020-06-14 LAB — NOVEL CORONAVIRUS, NAA: SARS-CoV-2, NAA: DETECTED — AB

## 2020-06-15 ENCOUNTER — Telehealth: Payer: Self-pay

## 2020-06-15 NOTE — Telephone Encounter (Signed)
Spoke to patient reviewed results and quarantine protocol. Repeated all information back to me. Also reviewed all red words that would require urgent evaluation. Patient describes symptoms as mild with only some cough and congestion at this time.

## 2020-06-17 NOTE — Telephone Encounter (Signed)
Noted  

## 2020-06-30 ENCOUNTER — Other Ambulatory Visit: Payer: Self-pay

## 2020-06-30 DIAGNOSIS — E119 Type 2 diabetes mellitus without complications: Secondary | ICD-10-CM

## 2020-06-30 MED ORDER — ONETOUCH ULTRA VI STRP: ORAL_STRIP | 5 refills | Status: DC

## 2020-06-30 MED ORDER — ONETOUCH DELICA LANCING DEV MISC: 5 refills | Status: AC

## 2020-08-06 ENCOUNTER — Other Ambulatory Visit: Payer: Self-pay

## 2020-08-06 ENCOUNTER — Ambulatory Visit (INDEPENDENT_AMBULATORY_CARE_PROVIDER_SITE_OTHER): Payer: Medicare Other | Admitting: Dermatology

## 2020-08-06 DIAGNOSIS — L570 Actinic keratosis: Secondary | ICD-10-CM

## 2020-08-06 DIAGNOSIS — L820 Inflamed seborrheic keratosis: Secondary | ICD-10-CM | POA: Diagnosis not present

## 2020-08-06 DIAGNOSIS — L578 Other skin changes due to chronic exposure to nonionizing radiation: Secondary | ICD-10-CM | POA: Diagnosis not present

## 2020-08-06 NOTE — Progress Notes (Signed)
Follow-Up Visit   Subjective  Johnathan Dominguez is a 70 y.o. male who presents for the following: 4 month follow up (Patient here today for follow up on ak on forehead. Patient states pdt on scalp, states he was unable to get treatment due to being sick. Denies new concerns today. ).  The following portions of the chart were reviewed this encounter and updated as appropriate:  Tobacco  Allergies  Meds  Problems  Med Hx  Surg Hx  Fam Hx      Objective  Well appearing patient in no apparent distress; mood and affect are within normal limits.  A focused examination was performed including scalp, forehead, bilateral arms, face . Relevant physical exam findings are noted in the Assessment and Plan.  Objective  scalp and arms x 2: Erythematous thin papules/macules with gritty scale.   Objective  Scalp/forehead  x 15: Erythematous keratotic or waxy stuck-on papule or plaque.   Assessment & Plan  Actinic keratosis scalp and arms x 2 Prior to procedure, discussed risks of blister formation, small wound, skin dyspigmentation, or rare scar following cryotherapy.   Destruction of lesion - scalp and arms x 2 Complexity: simple   Destruction method: cryotherapy   Informed consent: discussed and consent obtained   Timeout:  patient name, date of birth, surgical site, and procedure verified Lesion destroyed using liquid nitrogen: Yes   Region frozen until ice ball extended beyond lesion: Yes   Outcome: patient tolerated procedure well with no complications   Post-procedure details: wound care instructions given    Inflamed seborrheic keratosis Scalp/forehead  x 15 Prior to procedure, discussed risks of blister formation, small wound, skin dyspigmentation, or rare scar following cryotherapy.   Destruction of lesion - Scalp/forehead  x 15 Complexity: simple   Destruction method: cryotherapy   Informed consent: discussed and consent obtained   Timeout:  patient name, date of  birth, surgical site, and procedure verified Lesion destroyed using liquid nitrogen: Yes   Region frozen until ice ball extended beyond lesion: Yes   Outcome: patient tolerated procedure well with no complications   Post-procedure details: wound care instructions given   Purpura - Chronic; persistent and recurrent.  Treatable, but not curable. Bilateral arms - Violaceous macules and patches - Benign - Related to trauma, age, sun damage and/or use of blood thinners, chronic use of topical and/or oral steroids - Observe - Can use OTC arnica containing moisturizer such as Dermend Bruise Formula if desired - Call for worsening or other concerns  Actinic Damage - chronic, secondary to cumulative UV radiation exposure/sun exposure over time - diffuse scaly erythematous macules with underlying dyspigmentation - Recommend daily broad spectrum sunscreen SPF 30+ to sun-exposed areas, reapply every 2 hours as needed.  - Call for new or changing lesions.  Severe, Confluent Chronic Actinic Changes with Pre-Cancerous Actinic Keratoses due to cumulative sun exposure/UV radiation exposure over time - Discussed Prescription "Field Treatment" Field treatment involves treatment of an entire area of skin that has confluent Actinic Changes (Sun/ Ultraviolet light damage) and PreCancerous Actinic Keratoses by method of PhotoDynamic Therapy (PDT) and/or prescription Topical Chemotherapy agents such as 5-fluorouracil, 5-fluorouracil/calcipotriene, and/or imiquimod.  The purpose is to decrease the number of clinically evident and subclinical PreCancerous lesions to prevent progression to development of skin cancer by chemically destroying early precancer changes that may or may not be visible.  It has been shown to reduce the risk of developing skin cancer in the treated area. As a result  of treatment, redness, scaling, crusting, and open sores may occur during treatment course. One or more than one of these methods may  be used and may have to be used several times to control, suppress and eliminate the PreCancerous changes. Discussed treatment course, expected reaction, and possible side effects.   - Will schedule photodynamic therapy to the scalp and forehead in 6 weeks   Return in about 3 months (around 11/03/2020), or ak and pdt follow up , 6 week pdt.  IRuthell Rummage, CMA, am acting as scribe for Sarina Ser, MD.  Documentation: I have reviewed the above documentation for accuracy and completeness, and I agree with the above.  Sarina Ser, MD

## 2020-08-06 NOTE — Patient Instructions (Signed)
Cryotherapy Aftercare  . Wash gently with soap and water everyday.   . Apply Vaseline and Band-Aid daily until healed.  

## 2020-08-07 ENCOUNTER — Encounter: Payer: Self-pay | Admitting: Dermatology

## 2020-08-16 ENCOUNTER — Other Ambulatory Visit: Payer: Self-pay

## 2020-08-16 DIAGNOSIS — M1990 Unspecified osteoarthritis, unspecified site: Secondary | ICD-10-CM

## 2020-08-16 MED ORDER — MELOXICAM 15 MG PO TABS
15.0000 mg | ORAL_TABLET | Freq: Every day | ORAL | 0 refills | Status: DC
Start: 2020-08-16 — End: 2020-11-20

## 2020-08-16 NOTE — Telephone Encounter (Signed)
Refill provided, we will follow up the patient in June as scheduled.

## 2020-08-16 NOTE — Telephone Encounter (Signed)
Fax received for refill of Meloxicam. You have never given in the past.  Patient last seen video visit 06/06/2020 Has follow up in June

## 2020-08-19 ENCOUNTER — Other Ambulatory Visit: Payer: Self-pay | Admitting: Primary Care

## 2020-09-17 ENCOUNTER — Ambulatory Visit (INDEPENDENT_AMBULATORY_CARE_PROVIDER_SITE_OTHER): Payer: Medicare Other

## 2020-09-17 ENCOUNTER — Other Ambulatory Visit: Payer: Self-pay

## 2020-09-17 DIAGNOSIS — L570 Actinic keratosis: Secondary | ICD-10-CM | POA: Diagnosis not present

## 2020-09-17 MED ORDER — AMINOLEVULINIC ACID HCL 20 % EX SOLR
1.0000 | Freq: Once | CUTANEOUS | Status: AC
Start: 2020-09-17 — End: 2020-09-17
  Administered 2020-09-17: 354 mg via TOPICAL

## 2020-09-17 NOTE — Patient Instructions (Signed)

## 2020-09-17 NOTE — Progress Notes (Signed)
Patient completed PDT therapy today.  1. AK (actinic keratosis) Scalp  Photodynamic therapy - Scalp Procedure discussed: discussed risks, benefits, side effects. and alternatives   Prep: site scrubbed/prepped with acetone   Location:  Scalp and forehead Number of lesions:  Multiple Type of treatment:  Blue light Aminolevulinic Acid (see MAR for details): Levulan Number of Levulan sticks used:  1 Incubation time (minutes):  120 Number of minutes under lamp:  16 Number of seconds under lamp:  40 Cooling:  Floor fan Outcome: patient tolerated procedure well with no complications   Post-procedure details: sunscreen applied    Aminolevulinic Acid HCl 20 % SOLR 354 mg - Scalp

## 2020-10-25 LAB — HM DIABETES EYE EXAM

## 2020-11-06 ENCOUNTER — Other Ambulatory Visit: Payer: Self-pay

## 2020-11-06 ENCOUNTER — Ambulatory Visit (INDEPENDENT_AMBULATORY_CARE_PROVIDER_SITE_OTHER): Payer: Medicare Other | Admitting: Dermatology

## 2020-11-06 DIAGNOSIS — L578 Other skin changes due to chronic exposure to nonionizing radiation: Secondary | ICD-10-CM | POA: Diagnosis not present

## 2020-11-06 DIAGNOSIS — L570 Actinic keratosis: Secondary | ICD-10-CM

## 2020-11-06 DIAGNOSIS — D692 Other nonthrombocytopenic purpura: Secondary | ICD-10-CM

## 2020-11-06 NOTE — Progress Notes (Signed)
   Follow-Up Visit   Subjective  Johnathan Dominguez is a 70 y.o. male who presents for the following: Actinic Keratosis (2 month follow up of scalp - LN2 08/06/20 and PDT 09/17/2020).  He has other spots to be checked.  The following portions of the chart were reviewed this encounter and updated as appropriate:   Tobacco  Allergies  Meds  Problems  Med Hx  Surg Hx  Fam Hx     Review of Systems:  No other skin or systemic complaints except as noted in HPI or Assessment and Plan.  Objective  Well appearing patient in no apparent distress; mood and affect are within normal limits.  A focused examination was performed including scalp, face, arms. Relevant physical exam findings are noted in the Assessment and Plan.  Objective  Scalp/face (15): Erythematous thin papules/macules with gritty scale.    Assessment & Plan    Actinic Damage - chronic, secondary to cumulative UV radiation exposure/sun exposure over time - diffuse scaly erythematous macules with underlying dyspigmentation - Recommend daily broad spectrum sunscreen SPF 30+ to sun-exposed areas, reapply every 2 hours as needed.  - Recommend staying in the shade or wearing long sleeves, sun glasses (UVA+UVB protection) and wide brim hats (4-inch brim around the entire circumference of the hat). - Call for new or changing lesions.  Purpura - Chronic; persistent and recurrent.  Treatable, but not curable. - Violaceous macules and patches - Benign - Related to trauma, age, sun damage and/or use of blood thinners, chronic use of topical and/or oral steroids - Observe - Can use OTC arnica containing moisturizer such as Dermend Bruise Formula if desired - Call for worsening or other concerns  AK (actinic keratosis) (15) Scalp/face  Destruction of lesion - Scalp/face Complexity: simple   Destruction method: cryotherapy   Informed consent: discussed and consent obtained   Timeout:  patient name, date of birth, surgical  site, and procedure verified Lesion destroyed using liquid nitrogen: Yes   Region frozen until ice ball extended beyond lesion: Yes   Outcome: patient tolerated procedure well with no complications   Post-procedure details: wound care instructions given    Return in about 6 months (around 05/09/2021) for AK follow up.  I, Ashok Cordia, CMA, am acting as scribe for Sarina Ser, MD .  Documentation: I have reviewed the above documentation for accuracy and completeness, and I agree with the above.  Sarina Ser, MD

## 2020-11-06 NOTE — Patient Instructions (Signed)

## 2020-11-10 ENCOUNTER — Encounter: Payer: Self-pay | Admitting: Dermatology

## 2020-11-20 ENCOUNTER — Other Ambulatory Visit: Payer: Self-pay | Admitting: Primary Care

## 2020-11-20 DIAGNOSIS — M1990 Unspecified osteoarthritis, unspecified site: Secondary | ICD-10-CM

## 2020-11-21 ENCOUNTER — Encounter: Payer: Self-pay | Admitting: Primary Care

## 2020-11-28 ENCOUNTER — Ambulatory Visit (INDEPENDENT_AMBULATORY_CARE_PROVIDER_SITE_OTHER): Payer: Medicare Other

## 2020-11-28 ENCOUNTER — Other Ambulatory Visit: Payer: Self-pay

## 2020-11-28 DIAGNOSIS — Z0000 Encounter for general adult medical examination without abnormal findings: Secondary | ICD-10-CM | POA: Diagnosis not present

## 2020-11-28 NOTE — Progress Notes (Signed)
PCP notes:  Health Maintenance: Colonoscopy- declined Foot exam- due Covid booster- due  Abnormal Screenings: none   Patient concerns: Back pain on right side    Nurse concerns: none   Next PCP appt.: 12/05/2020 @ 8 am

## 2020-11-28 NOTE — Progress Notes (Signed)
Subjective:   Johnathan Dominguez is a 70 y.o. male who presents for Medicare Annual/Subsequent preventive examination.  Review of Systems: N/A      I connected with the patient today by telephone and verified that I am speaking with the correct person using two identifiers. Location patient: home Location nurse: work Persons participating in the telephone visit: patient, nurse.   I discussed the limitations, risks, security and privacy concerns of performing an evaluation and management service by telephone and the availability of in person appointments. I also discussed with the patient that there may be a patient responsible charge related to this service. The patient expressed understanding and verbally consented to this telephonic visit.        Cardiac Risk Factors include: advanced age (>24men, >28 women);diabetes mellitus;hypertension;male gender;Other (see comment), Risk factor comments: hyperlipidemia     Objective:    Today's Vitals   There is no height or weight on file to calculate BMI.  Advanced Directives 11/28/2020  Does Patient Have a Medical Advance Directive? No  Would patient like information on creating a medical advance directive? No - Patient declined    Current Medications (verified) Outpatient Encounter Medications as of 11/28/2020  Medication Sig   amLODipine (NORVASC) 5 MG tablet Take 1 tablet (5 mg total) by mouth daily. For blood pressure.   amoxicillin-clavulanate (AUGMENTIN) 875-125 MG tablet Take 1 tablet by mouth 2 (two) times daily.   ezetimibe (ZETIA) 10 MG tablet Take 1 tablet (10 mg total) by mouth daily. For cholesterol.   fluticasone (FLONASE) 50 MCG/ACT nasal spray Place 2 sprays into both nostrils daily.   fluticasone (VERAMYST) 27.5 MCG/SPRAY nasal spray Place 1 spray into the nose daily.   glipiZIDE (GLUCOTROL XL) 10 MG 24 hr tablet TAKE 1 TABLET BY MOUTH EVERY DAY WITH BREAKFAST FOR DIABETES   glucose blood (ONETOUCH ULTRA) test strip  Check sugar 2-3 times daily DX E11.9   Lancet Devices (ONE TOUCH DELICA LANCING DEV) MISC Use as instructed to test blood sugar daily   meloxicam (MOBIC) 15 MG tablet TAKE 1 TABLET BY MOUTH ONCE DAILY AS NEEDED FOR PAIN   metFORMIN (GLUCOPHAGE-XR) 500 MG 24 hr tablet Take 2 tablets (1,000 mg total) by mouth daily with breakfast. For diabetes.   tamsulosin (FLOMAX) 0.4 MG CAPS capsule Take 1 capsule (0.4 mg total) by mouth daily after supper. For urination.   valsartan (DIOVAN) 160 MG tablet Take 1 tablet (160 mg total) by mouth daily. For blood pressure.   venlafaxine XR (EFFEXOR-XR) 150 MG 24 hr capsule Take 1 capsule (150 mg total) by mouth daily with breakfast. For anxiety.   No facility-administered encounter medications on file as of 11/28/2020.    Allergies (verified) Statins   History: Past Medical History:  Diagnosis Date   Allergic rhinitis    BPH (benign prostatic hyperplasia)    Essential hypertension    GAD (generalized anxiety disorder)    Hyperlipidemia    Osteoarthritis    Past Surgical History:  Procedure Laterality Date   CERVICAL SPINE SURGERY     GALLBLADDER SURGERY  1979   HERNIA REPAIR     TONSILLECTOMY AND ADENOIDECTOMY     Family History  Problem Relation Age of Onset   Heart disease Mother    Hypertension Mother    Heart disease Maternal Grandfather    Social History   Socioeconomic History   Marital status: Married    Spouse name: Not on file   Number of children: Not on  file   Years of education: Not on file   Highest education level: Not on file  Occupational History   Not on file  Tobacco Use   Smoking status: Never   Smokeless tobacco: Never  Substance and Sexual Activity   Alcohol use: Not Currently   Drug use: Not on file   Sexual activity: Not on file  Other Topics Concern   Not on file  Social History Narrative   Not on file   Social Determinants of Health   Financial Resource Strain: Low Risk    Difficulty of Paying Living  Expenses: Not hard at all  Food Insecurity: No Food Insecurity   Worried About [place] in the Last Year: Never true   [place] in the Last Year: Never true  Transportation Needs: No Transportation Needs   Lack of Transportation (Medical): No   Lack of Transportation (Non-Medical): No  Physical Activity: Inactive   Days of Exercise per Week: 0 days   Minutes of Exercise per Session: 0 min  Stress: No Stress Concern Present   Feeling of Stress : Not at all  Social Connections: Not on file    Tobacco Counseling Counseling given: Not Answered   Clinical Intake:  Pre-visit preparation completed: Yes  Pain : No/denies pain     Nutritional Risks: None Diabetes: Yes CBG done?: No Did pt. bring in CBG monitor from home?: No  How often do you need to have someone help you when you read instructions, pamphlets, or other written materials from your doctor or pharmacy?: 1 - Never  Diabetic: Yes Nutrition Risk Assessment:  Has the patient had any N/V/D within the last 2 months?  No  Does the patient have any non-healing wounds?  No  Has the patient had any unintentional weight loss or weight gain?  No   Diabetes:  Is the patient diabetic?  Yes  If diabetic, was a CBG obtained today?  No  telephone visit  Did the patient bring in their glucometer from home?  No  telephone visit  How often do you monitor your CBG's? daily.   Financial Strains and Diabetes Management:  Are you having any financial strains with the device, your supplies or your medication? No .  Does the patient want to be seen by Chronic Care Management for management of their diabetes?  No  Would the patient like to be referred to a Nutritionist or for Diabetic Management?  No   Diabetic Exams:  Diabetic Eye Exam: Completed 10/25/2020 Diabetic Foot Exam: Overdue, Pt has been advised about the importance in completing this exam. Pt is scheduled for diabetic foot exam on  12/05/2020.   Interpreter Needed?: No  Information entered by :: CJohnson, LPN   Activities of Daily Living In your present state of health, do you have any difficulty performing the following activities: 11/28/2020 06/06/2020  Hearing? N N  Vision? N N  Difficulty concentrating or making decisions? N N  Walking or climbing stairs? N N  Dressing or bathing? N N  Doing errands, shopping? N N  Preparing Food and eating ? N -  Using the Toilet? N -  In the past six months, have you accidently leaked urine? N -  Do you have problems with loss of bowel control? N -  Managing your Medications? N -  Managing your Finances? N -  Housekeeping or managing your Housekeeping? N -  Some recent data might be hidden    Patient  Care Team: Pleas Koch, NP as PCP - General (Internal Medicine) Dwain Sarna, Padroni as Referring Physician (Optometry)  Indicate any recent Medical Services you may have received from other than [facility] providers in the past year (date may be approximate).     Assessment:   This is a routine wellness examination for Tkai.  Hearing/Vision screen Vision Screening - Comments:: Patient gets annual eye exams   Dietary issues and exercise activities discussed: Current Exercise Habits: The patient does not participate in regular exercise at present, Exercise limited by: None identified   Goals Addressed             This Visit's Progress    Patient Stated       11/28/2020, I will maintain and continue medications as prescribed.        Depression Screen PHQ 2/9 Scores 11/28/2020 06/06/2020  PHQ - 2 Score 0 0  PHQ- 9 Score 0 0    Fall Risk Fall Risk  11/28/2020 06/06/2020  Falls in the past year? 0 0  Number falls in past yr: 0 0  Injury with Fall? 0 0  Risk for fall due to : Medication side effect -  Follow up Falls evaluation completed;Falls prevention discussed -    FALL RISK PREVENTION PERTAINING TO THE HOME:  Any stairs in or around the  home? Yes  If so, are there any without handrails? No  Home free of loose throw rugs in walkways, pet beds, electrical cords, etc? Yes  Adequate lighting in your home to reduce risk of falls? Yes   ASSISTIVE DEVICES UTILIZED TO PREVENT FALLS:  Life alert? No  Use of a cane, walker or w/c? No  Grab bars in the bathroom? No  Shower chair or bench in shower? No  Elevated toilet seat or a handicapped toilet? No   TIMED UP AND GO:  Was the test performed?  N/A telephone visit .    Cognitive Function: MMSE - Mini Mental State Exam 11/28/2020  Not completed: Refused       Mini Cog  Mini-Cog screen was not completed. Patient refused. Maximum score is 22. A value of 0 denotes this part of the MMSE was not completed or the patient failed this part of the Mini-Cog screening.  Immunizations Immunization History  Administered Date(s) Administered   Influenza-Unspecified 05/05/2020   PFIZER(Purple Top)SARS-COV-2 Vaccination 08/19/2019, 09/09/2019    TDAP status: Due, Education has been provided regarding the importance of this vaccine. Advised may receive this vaccine at local pharmacy or Health Dept. Aware to provide a copy of the vaccination record if obtained from local pharmacy or Health Dept. Verbalized acceptance and understanding.  Flu Vaccine status: Up to date  Pneumococcal vaccine status: Up to date per patient advised to bring documentation to visit so chart can be updated  Covid-19 vaccine status: Completed 2 vaccines, patient aware booster due   Qualifies for Shingles Vaccine? Yes   Zostavax completed No   Shingrix Completed?: Yes Completed series per patient advised to bring documentation to visit so chart can be updated  Screening Tests Health Maintenance  Topic Date Due   Hepatitis C Screening  Never done   TETANUS/TDAP  Never done   Zoster Vaccines- Shingrix (1 of 2) Never done   COLONOSCOPY (Pts 45-26yrs Insurance coverage will need to be confirmed)  Never done    PNA vac Low Risk Adult (1 of 2 - PCV13) Never done   COVID-19 Vaccine (3 - Pfizer risk series) 10/07/2019  FOOT EXAM  08/31/2020   HEMOGLOBIN A1C  12/05/2020   INFLUENZA VACCINE  01/14/2021   OPHTHALMOLOGY EXAM  10/25/2021   HPV VACCINES  Aged Out    Health Maintenance  Health Maintenance Due  Topic Date Due   Hepatitis C Screening  Never done   TETANUS/TDAP  Never done   Zoster Vaccines- Shingrix (1 of 2) Never done   COLONOSCOPY (Pts 45-23yrs Insurance coverage will need to be confirmed)  Never done   PNA vac Low Risk Adult (1 of 2 - PCV13) Never done   COVID-19 Vaccine (3 - Pfizer risk series) 10/07/2019   FOOT EXAM  08/31/2020    Colorectal cancer screening: declined  Lung Cancer Screening: (Low Dose CT Chest recommended if Age 64-80 years, 30 pack-year currently smoking OR have quit w/in 15years.) does not qualify.    Additional Screening:  Hepatitis C Screening: does qualify; Completed due  Vision Screening: Recommended annual ophthalmology exams for early detection of glaucoma and other disorders of the eye. Is the patient up to date with their annual eye exam?  Yes  Who is the provider or what is the name of the office in which the patient attends annual eye exams? Dr. Ellin Mayhew If pt is not established with a provider, would they like to be referred to a provider to establish care? No .   Dental Screening: Recommended annual dental exams for proper oral hygiene  Community Resource Referral / Chronic Care Management: CRR required this visit?  No   CCM required this visit?  No      Plan:     I have personally reviewed and noted the following in the patient's chart:   Medical and social history Use of alcohol, tobacco or illicit drugs  Current medications and supplements including opioid prescriptions. Patient is not currently taking opioid prescriptions. Functional ability and status Nutritional status Physical activity Advanced directives List of  other physicians Hospitalizations, surgeries, and ER visits in previous 12 months Vitals Screenings to include cognitive, depression, and falls Referrals and appointments  In addition, I have reviewed and discussed with patient certain preventive protocols, quality metrics, and best practice recommendations. A written personalized care plan for preventive services as well as general preventive health recommendations were provided to patient.   Due to this being a telephonic visit, the after visit summary with patients personalized plan was offered to patient via office or my-chart. Patient preferred to pick up at office at next visit or via mychart.   Andrez Grime, LPN   [id number]

## 2020-11-28 NOTE — Patient Instructions (Signed)
Johnathan Dominguez , Thank you for taking time to come for your Medicare Wellness Visit. I appreciate your ongoing commitment to your health goals. Please review the following plan we discussed and let me know if I can assist you in the future.   Screening recommendations/referrals: Colonoscopy: declined Recommended yearly ophthalmology/optometry visit for glaucoma screening and checkup Recommended yearly dental visit for hygiene and checkup  Vaccinations: Influenza vaccine: Up to date, completed 05/05/2020, due 01/2021 Pneumococcal vaccine: Completed series per patient Please bring documentation to visit so chart can be updated Tdap vaccine: decline-insurance Shingles vaccine: Completed series per patient Please bring documentation to visit so chart can be updated   Covid-19: completed 2 doses, booster due   Advanced directives: Advance directive discussed with you today. Even though you declined this today please call our office should you change your mind and we can give you the proper paperwork for you to fill out.  Conditions/risks identified: diabetes, hypertension, hyperlipidemia   Next appointment: Follow up in one year for your annual wellness visit.   Preventive Care 39 Years and Older, Male Preventive care refers to lifestyle choices and visits with your health care provider that can promote health and wellness. What does preventive care include? A yearly physical exam. This is also called an annual well check. Dental exams once or twice a year. Routine eye exams. Ask your health care provider how often you should have your eyes checked. Personal lifestyle choices, including: Daily care of your teeth and gums. Regular physical activity. Eating a healthy diet. Avoiding tobacco and drug use. Limiting alcohol use. Practicing safe sex. Taking low doses of aspirin every day. Taking vitamin and mineral supplements as recommended by your health care provider. What happens during an  annual well check? The services and screenings done by your health care provider during your annual well check will depend on your age, overall health, lifestyle risk factors, and family history of disease. Counseling  Your health care provider may ask you questions about your: Alcohol use. Tobacco use. Drug use. Emotional well-being. Home and relationship well-being. Sexual activity. Eating habits. History of falls. Memory and ability to understand (cognition). Work and work Statistician. Screening  You may have the following tests or measurements: Height, weight, and BMI. Blood pressure. Lipid and cholesterol levels. These may be checked every 5 years, or more frequently if you are over 1 years old. Skin check. Lung cancer screening. You may have this screening every year starting at age 39 if you have a 30-pack-year history of smoking and currently smoke or have quit within the past 15 years. Fecal occult blood test (FOBT) of the stool. You may have this test every year starting at age 34. Flexible sigmoidoscopy or colonoscopy. You may have a sigmoidoscopy every 5 years or a colonoscopy every 10 years starting at age 48. Prostate cancer screening. Recommendations will vary depending on your family history and other risks. Hepatitis C blood test. Hepatitis B blood test. Sexually transmitted disease (STD) testing. Diabetes screening. This is done by checking your blood sugar (glucose) after you have not eaten for a while (fasting). You may have this done every 1-3 years. Abdominal aortic aneurysm (AAA) screening. You may need this if you are a current or former smoker. Osteoporosis. You may be screened starting at age 44 if you are at high risk. Talk with your health care provider about your test results, treatment options, and if necessary, the need for more tests. Vaccines  Your health care provider may  recommend certain vaccines, such as: Influenza vaccine. This is recommended  every year. Tetanus, diphtheria, and acellular pertussis (Tdap, Td) vaccine. You may need a Td booster every 10 years. Zoster vaccine. You may need this after age 81. Pneumococcal 13-valent conjugate (PCV13) vaccine. One dose is recommended after age 58. Pneumococcal polysaccharide (PPSV23) vaccine. One dose is recommended after age 42. Talk to your health care provider about which screenings and vaccines you need and how often you need them. This information is not intended to replace advice given to you by your health care provider. Make sure you discuss any questions you have with your health care provider. Document Released: 06/29/2015 Document Revised: 02/20/2016 Document Reviewed: 04/03/2015 Elsevier Interactive Patient Education  2017 [place] Prevention in the Home Falls can cause injuries. They can happen to people of all ages. There are many things you can do to make your home safe and to help prevent falls. What can I do on the outside of my home? Regularly fix the edges of walkways and driveways and fix any cracks. Remove anything that might make you trip as you walk through a door, such as a raised step or threshold. Trim any bushes or trees on the path to your home. Use bright outdoor lighting. Clear any walking paths of anything that might make someone trip, such as rocks or tools. Regularly check to see if handrails are loose or broken. Make sure that both sides of any steps have handrails. Any raised decks and porches should have guardrails on the edges. Have any leaves, snow, or ice cleared regularly. Use sand or salt on walking paths during winter. Clean up any spills in your garage right away. This includes oil or grease spills. What can I do in the bathroom? Use night lights. Install grab bars by the toilet and in the tub and shower. Do not use towel bars as grab bars. Use non-skid mats or decals in the tub or shower. If you need to sit down in the shower,  use a plastic, non-slip stool. Keep the floor dry. Clean up any water that spills on the floor as soon as it happens. Remove soap buildup in the tub or shower regularly. Attach bath mats securely with double-sided non-slip rug tape. Do not have throw rugs and other things on the floor that can make you trip. What can I do in the bedroom? Use night lights. Make sure that you have a light by your bed that is easy to reach. Do not use any sheets or blankets that are too big for your bed. They should not hang down onto the floor. Have a firm chair that has side arms. You can use this for support while you get dressed. Do not have throw rugs and other things on the floor that can make you trip. What can I do in the kitchen? Clean up any spills right away. Avoid walking on wet floors. Keep items that you use a lot in easy-to-reach places. If you need to reach something above you, use a strong step stool that has a grab bar. Keep electrical cords out of the way. Do not use floor polish or wax that makes floors slippery. If you must use wax, use non-skid floor wax. Do not have throw rugs and other things on the floor that can make you trip. What can I do with my stairs? Do not leave any items on the stairs. Make sure that there are handrails on both sides of  the stairs and use them. Fix handrails that are broken or loose. Make sure that handrails are as long as the stairways. Check any carpeting to make sure that it is firmly attached to the stairs. Fix any carpet that is loose or worn. Avoid having throw rugs at the top or bottom of the stairs. If you do have throw rugs, attach them to the floor with carpet tape. Make sure that you have a light switch at the top of the stairs and the bottom of the stairs. If you do not have them, ask someone to add them for you. What else can I do to help prevent falls? Wear shoes that: Do not have high heels. Have rubber bottoms. Are comfortable and fit you  well. Are closed at the toe. Do not wear sandals. If you use a stepladder: Make sure that it is fully opened. Do not climb a closed stepladder. Make sure that both sides of the stepladder are locked into place. Ask someone to hold it for you, if possible. Clearly mark and make sure that you can see: Any grab bars or handrails. First and last steps. Where the edge of each step is. Use tools that help you move around (mobility aids) if they are needed. These include: Canes. Walkers. Scooters. Crutches. Turn on the lights when you go into a dark area. Replace any light bulbs as soon as they burn out. Set up your furniture so you have a clear path. Avoid moving your furniture around. If any of your floors are uneven, fix them. If there are any pets around you, be aware of where they are. Review your medicines with your doctor. Some medicines can make you feel dizzy. This can increase your chance of falling. Ask your doctor what other things that you can do to help prevent falls. This information is not intended to replace advice given to you by your health care provider. Make sure you discuss any questions you have with your health care provider. Document Released: 03/29/2009 Document Revised: 11/08/2015 Document Reviewed: 07/07/2014 Elsevier Interactive Patient Education  2017 [employer].

## 2020-12-05 ENCOUNTER — Ambulatory Visit (INDEPENDENT_AMBULATORY_CARE_PROVIDER_SITE_OTHER): Payer: Medicare Other | Admitting: Primary Care

## 2020-12-05 ENCOUNTER — Ambulatory Visit (INDEPENDENT_AMBULATORY_CARE_PROVIDER_SITE_OTHER)
Admission: RE | Admit: 2020-12-05 | Discharge: 2020-12-05 | Disposition: A | Discharge status: Home / Self Care | Payer: Medicare Other | Source: Ambulatory Visit | Attending: Primary Care | Admitting: Primary Care

## 2020-12-05 ENCOUNTER — Other Ambulatory Visit: Payer: Self-pay

## 2020-12-05 ENCOUNTER — Other Ambulatory Visit: Payer: Self-pay | Admitting: Primary Care

## 2020-12-05 ENCOUNTER — Encounter: Payer: Self-pay | Admitting: Primary Care

## 2020-12-05 VITALS — BP 124/76 | HR 97 | Temp 97.4°F | Ht 66.0 in

## 2020-12-05 DIAGNOSIS — Z1159 Encounter for screening for other viral diseases: Secondary | ICD-10-CM | POA: Diagnosis not present

## 2020-12-05 DIAGNOSIS — E119 Type 2 diabetes mellitus without complications: Secondary | ICD-10-CM

## 2020-12-05 DIAGNOSIS — F411 Generalized anxiety disorder: Secondary | ICD-10-CM

## 2020-12-05 DIAGNOSIS — E785 Hyperlipidemia, unspecified: Secondary | ICD-10-CM

## 2020-12-05 DIAGNOSIS — M546 Pain in thoracic spine: Secondary | ICD-10-CM

## 2020-12-05 DIAGNOSIS — Z125 Encounter for screening for malignant neoplasm of prostate: Secondary | ICD-10-CM | POA: Diagnosis not present

## 2020-12-05 DIAGNOSIS — M1990 Unspecified osteoarthritis, unspecified site: Secondary | ICD-10-CM

## 2020-12-05 DIAGNOSIS — I10 Essential (primary) hypertension: Secondary | ICD-10-CM

## 2020-12-05 DIAGNOSIS — G8929 Other chronic pain: Secondary | ICD-10-CM

## 2020-12-05 DIAGNOSIS — J309 Allergic rhinitis, unspecified: Secondary | ICD-10-CM

## 2020-12-05 DIAGNOSIS — N400 Enlarged prostate without lower urinary tract symptoms: Secondary | ICD-10-CM

## 2020-12-05 DIAGNOSIS — F331 Major depressive disorder, recurrent, moderate: Secondary | ICD-10-CM

## 2020-12-05 LAB — COMPREHENSIVE METABOLIC PANEL
ALT: 36 U/L (ref 0–53)
AST: 24 U/L (ref 0–37)
Albumin: 4.4 g/dL (ref 3.5–5.2)
Alkaline Phosphatase: 64 U/L (ref 39–117)
BUN: 17 mg/dL (ref 6–23)
CO2: 27 mEq/L (ref 19–32)
Calcium: 9.7 mg/dL (ref 8.4–10.5)
Chloride: 102 mEq/L (ref 96–112)
Creatinine, Ser: 1.06 mg/dL (ref 0.40–1.50)
GFR: 70.59 mL/min (ref >60.00)
Glucose, Bld: 153 mg/dL — ABNORMAL HIGH (ref 70–99)
Potassium: 4.4 mEq/L (ref 3.5–5.1)
Sodium: 137 mEq/L (ref 135–145)
Total Bilirubin: 0.6 mg/dL (ref 0.2–1.2)
Total Protein: 6.9 g/dL (ref 6.0–8.3)

## 2020-12-05 LAB — CBC
HCT: 42.9 % (ref 39.0–52.0)
Hemoglobin: 14.7 g/dL (ref 13.0–17.0)
MCHC: 34.3 g/dL (ref 30.0–36.0)
MCV: 87.4 fl (ref 78.0–100.0)
Platelets: 259 10*3/uL (ref 150.0–400.0)
RBC: 4.91 Mil/uL (ref 4.22–5.81)
RDW: 13.7 % (ref 11.5–15.5)
WBC: 7.9 10*3/uL (ref 4.0–10.5)

## 2020-12-05 LAB — POCT GLYCOSYLATED HEMOGLOBIN (HGB A1C): Hemoglobin A1C: 6.7 % — AB (ref 4.0–5.6)

## 2020-12-05 LAB — LIPID PANEL
Cholesterol: 171 mg/dL (ref 0–200)
HDL: 38.1 mg/dL — ABNORMAL LOW (ref >39.00)
NonHDL: 132.44
Total CHOL/HDL Ratio: 4
Triglycerides: 228 mg/dL — ABNORMAL HIGH (ref 0.0–149.0)
VLDL: 45.6 mg/dL — ABNORMAL HIGH (ref 0.0–40.0)

## 2020-12-05 LAB — PSA, MEDICARE: PSA: 3.98 ng/ml (ref 0.10–4.00)

## 2020-12-05 LAB — LDL CHOLESTEROL, DIRECT: Direct LDL: 108 mg/dL

## 2020-12-05 MED ORDER — BUPROPION HCL ER (SR) 100 MG PO TB12: 100.0000 mg | ORAL_TABLET | Freq: Two times a day (BID) | ORAL | 0 refills | Status: DC

## 2020-12-05 NOTE — Assessment & Plan Note (Signed)
Chronic but more recent increase in symptoms.  Continue venlafaxine ER 150 mg as he is already at a high dose.  Add in bupropion SR 100 mg once daily x 5 days, then increase to BID thereafter. He will update.

## 2020-12-05 NOTE — Assessment & Plan Note (Signed)
Chronic, compliant to Flonase, Mucinex. Not taking antihistamine. Continue current regimen.

## 2020-12-05 NOTE — Assessment & Plan Note (Signed)
No abnormalities on exam. Checking chest xray today.

## 2020-12-05 NOTE — Assessment & Plan Note (Signed)
Denies concerns today. Continue tamsulosin 0.4 mg daily.

## 2020-12-05 NOTE — Progress Notes (Signed)
Subjective:    Patient ID: Johnathan Dominguez, male    DOB: 04/19/1951, 70 y.o.   MRN: [id number]  HPI  Johnathan Dominguez is a very pleasant 70 y.o. male who presents today for follow up of chronic conditions.  He would like to discuss a dose increase of his anxiety medication. He's been under a lot of stress over the last year, difficulty in controlling the stress. Symptoms including irritability, feeling down/depressed, feeling sad. He is currently managed on venlafaxine ER 150 mg, feels that this is helping but not enough.   Chronic right sided thoracic back pain for the last 6 months, intermittent. Denies radiculopathy, numbness/tingling, injury/trauma, loss of bowel/bladder control. Pain is worse after standing for prolonged periods of time, improved with sitting. No pain with movement/activity. He occasionally takes Tylenol and Ibuprofen with improvement. He is a non smoker, does experience some shortness of breath.   Immunizations: -Influenza: Completed last season  -Covid-19: 2 vaccines -Shingles: Shingrix  -Pneumonia: Completed per patient   Colonoscopy: Completed several years ago, due at age 9 per patient PSA: Due  BP Readings from Last 3 Encounters:  12/05/20 124/76  12/13/19 124/84  11/18/19 122/82       Review of Systems  Eyes:  Negative for visual disturbance.  Respiratory:  Positive for shortness of breath.   Cardiovascular:  Negative for chest pain.  Genitourinary:  Negative for difficulty urinating.  Musculoskeletal:  Positive for arthralgias and back pain.  Neurological:  Positive for dizziness. Negative for numbness and headaches.        Past Medical History:  Diagnosis Date   Allergic rhinitis    BPH (benign prostatic hyperplasia)    Essential hypertension    GAD (generalized anxiety disorder)    Hyperlipidemia    Osteoarthritis     Social History   Socioeconomic History   Marital status: Married    Spouse name: Not on file   Number  of children: Not on file   Years of education: Not on file   Highest education level: Not on file  Occupational History   Not on file  Tobacco Use   Smoking status: Never   Smokeless tobacco: Never  Substance and Sexual Activity   Alcohol use: Not Currently   Drug use: Not on file   Sexual activity: Not on file  Other Topics Concern   Not on file  Social History Narrative   Not on file   Social Determinants of Health   Financial Resource Strain: Low Risk    Difficulty of Paying Living Expenses: Not hard at all  Food Insecurity: No Food Insecurity   Worried About Running Out of Food in the Last Year: Never true   Ran Out of Food in the Last Year: Never true  Transportation Needs: No Transportation Needs   Lack of Transportation (Medical): No   Lack of Transportation (Non-Medical): No  Physical Activity: Inactive   Days of Exercise per Week: 0 days   Minutes of Exercise per Session: 0 min  Stress: No Stress Concern Present   Feeling of Stress : Not at all  Social Connections: Not on file  Intimate Partner Violence: Not At Risk   Fear of Current or Ex-Partner: No   Emotionally Abused: No   Physically Abused: No   Sexually Abused: No    Past Surgical History:  Procedure Laterality Date   [place]  TONSILLECTOMY AND ADENOIDECTOMY      Family History  Problem Relation Age of Onset   Heart disease Mother    Hypertension Mother    Heart disease Maternal Grandfather     Allergies  Allergen Reactions   Statins     Current Outpatient Medications on File Prior to Visit  Medication Sig Dispense Refill   amLODipine (NORVASC) 5 MG tablet Take 1 tablet (5 mg total) by mouth daily. For blood pressure. 90 tablet 3   ezetimibe (ZETIA) 10 MG tablet Take 1 tablet (10 mg total) by mouth daily. For cholesterol. 90 tablet 3   fluticasone (FLONASE) 50 MCG/ACT nasal spray Place 2 sprays into both nostrils daily.      glipiZIDE (GLUCOTROL XL) 10 MG 24 hr tablet TAKE 1 TABLET BY MOUTH EVERY DAY WITH BREAKFAST FOR DIABETES 90 tablet 0   glucose blood (ONETOUCH ULTRA) test strip Check sugar 2-3 times daily DX E11.9 100 each 5   Lancet Devices (ONE TOUCH DELICA LANCING DEV) MISC Use as instructed to test blood sugar daily 100 each 5   meloxicam (MOBIC) 15 MG tablet TAKE 1 TABLET BY MOUTH ONCE DAILY AS NEEDED FOR PAIN 90 tablet 0   metFORMIN (GLUCOPHAGE-XR) 500 MG 24 hr tablet Take 2 tablets (1,000 mg total) by mouth daily with breakfast. For diabetes. 180 tablet 3   tamsulosin (FLOMAX) 0.4 MG CAPS capsule Take 1 capsule (0.4 mg total) by mouth daily after supper. For urination. 90 capsule 3   valsartan (DIOVAN) 160 MG tablet Take 1 tablet (160 mg total) by mouth daily. For blood pressure. 90 tablet 3   venlafaxine XR (EFFEXOR-XR) 150 MG 24 hr capsule Take 1 capsule (150 mg total) by mouth daily with breakfast. For anxiety. 90 capsule 3   BESIVANCE 0.6 % SUSP Place 1 drop into the right eye 3 (three) times daily.     erythromycin ophthalmic ointment SMARTSIG:In Eye(s)     No current facility-administered medications on file prior to visit.    BP 124/76   Pulse 97   Temp (!) 97.4 F (36.3 C) (Temporal)   Ht 5\' 6"  (1.676 m)   SpO2 97%   BMI 32.93 kg/m  Objective:   Physical Exam Cardiovascular:     Rate and Rhythm: Normal rate and regular rhythm.  Pulmonary:     Effort: Pulmonary effort is normal.     Breath sounds: Normal breath sounds. No wheezing or rales.  Musculoskeletal:     Cervical back: Neck supple.     Thoracic back: No spasms, tenderness or bony tenderness. Normal range of motion.       Back:     Comments: Normal ROM of thoracic back.  Skin:    General: Skin is warm and dry.  Neurological:     Mental Status: He is alert and oriented to person, place, and time.          Assessment & Plan:      This visit occurred during the SARS-CoV-2 public health emergency.  Safety protocols  were in place, including screening questions prior to the visit, additional usage of staff PPE, and extensive cleaning of exam room while observing appropriate contact time as indicated for disinfecting solutions.

## 2020-12-05 NOTE — Assessment & Plan Note (Signed)
Intolerant to statins. Continue Zetia 10 mg.  Repeat lipids pending.

## 2020-12-05 NOTE — Patient Instructions (Signed)
Complete xray(s) and labs prior to leaving today. I will notify you of your results once received.  For depression:  Start bupropion (Wellbutrin) SR 100 mg twice daily. Take 1 tablet by mouth once daily for 5 days, then increase to 1 tablet twice daily thereafter.   Continue taking venlafaxine ER 150 mg daily for anxiety and depression.   Please schedule a follow up visit for 6 months for diabetes check.   It was a pleasure to see you today!

## 2020-12-05 NOTE — Assessment & Plan Note (Addendum)
Controlled with A1C today of 6.7. Continue Glipizide XL 10 mg and metformin XR 1000 mg.    Managed on ARB. Cannot tolerate statins, is on Zetia.   Follow up in 6 months.

## 2020-12-05 NOTE — Assessment & Plan Note (Signed)
Somewhat deteriorated, seems to have more depression. Continue venlafaxine ER 150 mg daily. See notes under depression.

## 2020-12-05 NOTE — Assessment & Plan Note (Signed)
Chronic, stable on Meloxicam 15 mg daily. Continue same.

## 2020-12-05 NOTE — Assessment & Plan Note (Signed)
Well controlled in the office today, continue valsartan 160 mg, amlodipine 5 mg.  Labs pending.

## 2020-12-06 LAB — HEPATITIS C ANTIBODY
Hepatitis C Ab: NONREACTIVE
SIGNAL TO CUT-OFF: 0.01 (ref <1.00)

## 2020-12-12 ENCOUNTER — Other Ambulatory Visit: Payer: Self-pay | Admitting: Primary Care

## 2020-12-12 DIAGNOSIS — F411 Generalized anxiety disorder: Secondary | ICD-10-CM

## 2020-12-28 ENCOUNTER — Other Ambulatory Visit: Payer: Self-pay | Admitting: Primary Care

## 2020-12-28 DIAGNOSIS — E785 Hyperlipidemia, unspecified: Secondary | ICD-10-CM

## 2021-01-07 ENCOUNTER — Other Ambulatory Visit: Payer: Self-pay | Admitting: Primary Care

## 2021-01-07 DIAGNOSIS — N400 Enlarged prostate without lower urinary tract symptoms: Secondary | ICD-10-CM

## 2021-02-18 ENCOUNTER — Other Ambulatory Visit: Payer: Self-pay | Admitting: Primary Care

## 2021-02-18 DIAGNOSIS — M1990 Unspecified osteoarthritis, unspecified site: Secondary | ICD-10-CM

## 2021-02-18 DIAGNOSIS — I10 Essential (primary) hypertension: Secondary | ICD-10-CM

## 2021-02-27 ENCOUNTER — Other Ambulatory Visit: Payer: Self-pay | Admitting: Primary Care

## 2021-02-27 DIAGNOSIS — E119 Type 2 diabetes mellitus without complications: Secondary | ICD-10-CM

## 2021-03-26 ENCOUNTER — Other Ambulatory Visit: Payer: Self-pay | Admitting: Primary Care

## 2021-03-26 DIAGNOSIS — E785 Hyperlipidemia, unspecified: Secondary | ICD-10-CM

## 2021-05-15 ENCOUNTER — Ambulatory Visit (INDEPENDENT_AMBULATORY_CARE_PROVIDER_SITE_OTHER): Payer: Medicare Other | Admitting: Dermatology

## 2021-05-15 ENCOUNTER — Other Ambulatory Visit: Payer: Self-pay

## 2021-05-15 DIAGNOSIS — L578 Other skin changes due to chronic exposure to nonionizing radiation: Secondary | ICD-10-CM | POA: Diagnosis not present

## 2021-05-15 DIAGNOSIS — D229 Melanocytic nevi, unspecified: Secondary | ICD-10-CM

## 2021-05-15 DIAGNOSIS — Z1283 Encounter for screening for malignant neoplasm of skin: Secondary | ICD-10-CM

## 2021-05-15 DIAGNOSIS — L570 Actinic keratosis: Secondary | ICD-10-CM

## 2021-05-15 DIAGNOSIS — D1800 Hemangioma unspecified site: Secondary | ICD-10-CM

## 2021-05-15 DIAGNOSIS — D692 Other nonthrombocytopenic purpura: Secondary | ICD-10-CM

## 2021-05-15 DIAGNOSIS — L821 Other seborrheic keratosis: Secondary | ICD-10-CM

## 2021-05-15 DIAGNOSIS — L814 Other melanin hyperpigmentation: Secondary | ICD-10-CM

## 2021-05-15 DIAGNOSIS — L820 Inflamed seborrheic keratosis: Secondary | ICD-10-CM

## 2021-05-15 NOTE — Progress Notes (Signed)
Follow-Up Visit   Subjective  Johnathan Dominguez is a 70 y.o. male who presents for the following: Actinic Keratosis (6 months f/u hx of Aks). Past treatment PDT on the scalp was in 7 months ago. Pt c/o several irritated spots need to be checked today on the face, scalp and neck.  The patient presents for Upper Body Skin Exam (UBSE) for skin cancer screening and mole check.   The following portions of the chart were reviewed this encounter and updated as appropriate:   Tobacco  Allergies  Meds  Problems  Med Hx  Surg Hx  Fam Hx     Review of Systems:  No other skin or systemic complaints except as noted in HPI or Assessment and Plan.  Objective  Well appearing patient in no apparent distress; mood and affect are within normal limits.  All skin waist up examined.  face,scalp x 19 (19) Erythematous thin papules/macules with gritty scale.   left neck x 2 (2) Erythematous keratotic or waxy stuck-on papule or plaque.    Assessment & Plan  AK (actinic keratosis) (19) face,scalp x 19  Actinic keratoses are precancerous spots that appear secondary to cumulative UV radiation exposure/sun exposure over time. They are chronic with expected duration over 1 year. A portion of actinic keratoses will progress to squamous cell carcinoma of the skin. It is not possible to reliably predict which spots will progress to skin cancer and so treatment is recommended to prevent development of skin cancer.  Recommend daily broad spectrum sunscreen SPF 30+ to sun-exposed areas, reapply every 2 hours as needed.  Recommend staying in the shade or wearing long sleeves, sun glasses (UVA+UVB protection) and wide brim hats (4-inch brim around the entire circumference of the hat). Call for new or changing lesions.   Destruction of lesion - face,scalp x 19 Complexity: simple   Destruction method: cryotherapy   Informed consent: discussed and consent obtained   Timeout:  patient name, date of birth,  surgical site, and procedure verified Lesion destroyed using liquid nitrogen: Yes   Region frozen until ice ball extended beyond lesion: Yes   Outcome: patient tolerated procedure well with no complications   Post-procedure details: wound care instructions given    Inflamed seborrheic keratosis left neck x 2  Reassured benign age-related growth.  Recommend observation.  Discussed cryotherapy if spot(s) become irritated or inflamed.   Prior to procedure, discussed risks of blister formation, small wound, skin dyspigmentation, or rare scar following cryotherapy. Recommend Vaseline ointment to treated areas while healing.   Destruction of lesion - left neck x 2 Complexity: simple   Destruction method: cryotherapy   Informed consent: discussed and consent obtained   Timeout:  patient name, date of birth, surgical site, and procedure verified Lesion destroyed using liquid nitrogen: Yes   Region frozen until ice ball extended beyond lesion: Yes   Outcome: patient tolerated procedure well with no complications   Post-procedure details: wound care instructions given    Skin cancer screening  Purpura - Chronic; persistent and recurrent.  Treatable, but not curable. - Violaceous macules and patches - Benign - Related to trauma, age, sun damage and/or use of blood thinners, chronic use of topical and/or oral steroids - Observe - Can use OTC arnica containing moisturizer such as Dermend Bruise Formula if desired - Call for worsening or other concerns   Lentigines - Scattered tan macules - Due to sun exposure - Benign-appearing, observe - Recommend daily broad spectrum sunscreen SPF 30+ to  sun-exposed areas, reapply every 2 hours as needed. - Call for any changes  Seborrheic Keratoses - Stuck-on, waxy, tan-brown papules and/or plaques  - Benign-appearing - Discussed benign etiology and prognosis. - Observe - Call for any changes  Melanocytic Nevi - Tan-brown and/or  pink-flesh-colored symmetric macules and papules - Benign appearing on exam today - Observation - Call clinic for new or changing moles - Recommend daily use of broad spectrum spf 30+ sunscreen to sun-exposed areas.   Hemangiomas - Red papules - Discussed benign nature - Observe - Call for any changes  Actinic Damage - Chronic condition, secondary to cumulative UV/sun exposure - diffuse scaly erythematous macules with underlying dyspigmentation - Recommend daily broad spectrum sunscreen SPF 30+ to sun-exposed areas, reapply every 2 hours as needed.  - Staying in the shade or wearing long sleeves, sun glasses (UVA+UVB protection) and wide brim hats (4-inch brim around the entire circumference of the hat) are also recommended for sun protection.  - Call for new or changing lesions.  Skin cancer screening performed today.   Return in about 6 months (around 11/12/2021) for Aks, ISKs.  IMarye Round, CMA, am acting as scribe for Sarina Ser, MD .  Documentation: I have reviewed the above documentation for accuracy and completeness, and I agree with the above.  Sarina Ser, MD

## 2021-05-15 NOTE — Patient Instructions (Addendum)

## 2021-05-22 ENCOUNTER — Other Ambulatory Visit: Payer: Self-pay | Admitting: Primary Care

## 2021-05-22 ENCOUNTER — Encounter: Payer: Self-pay | Admitting: Dermatology

## 2021-05-22 DIAGNOSIS — M1990 Unspecified osteoarthritis, unspecified site: Secondary | ICD-10-CM

## 2021-06-06 ENCOUNTER — Ambulatory Visit (INDEPENDENT_AMBULATORY_CARE_PROVIDER_SITE_OTHER): Payer: Medicare Other | Admitting: Primary Care

## 2021-06-06 ENCOUNTER — Other Ambulatory Visit: Payer: Self-pay

## 2021-06-06 VITALS — BP 130/80 | HR 82 | Temp 98.6°F | Ht 66.0 in | Wt 226.0 lb

## 2021-06-06 DIAGNOSIS — E119 Type 2 diabetes mellitus without complications: Secondary | ICD-10-CM | POA: Diagnosis not present

## 2021-06-06 LAB — POCT GLYCOSYLATED HEMOGLOBIN (HGB A1C): Hemoglobin A1C: 7.2 % — AB (ref 4.0–5.6)

## 2021-06-06 MED ORDER — GLIPIZIDE ER 2.5 MG PO TB24: 2.5000 mg | ORAL_TABLET | Freq: Every day | ORAL | 1 refills | Status: DC

## 2021-06-06 NOTE — Patient Instructions (Signed)
Continue taking metformin XR 500 mg ( 2 tablets) daily for diabetes.  Start taking glipizide XL 2.5 mg daily with breakfast for diabetes.  Schedule a lab appointment for 3 months to recheck your diabetes test.  Schedule your physical for 6 months.  It was a pleasure to see you today!  Diabetes Mellitus and Nutrition, Adult When you have diabetes, or diabetes mellitus, it is very important to have healthy eating habits because your blood sugar (glucose) levels are greatly affected by what you eat and drink. Eating healthy foods in the right amounts, at about the same times every day, can help you: Manage your blood glucose. Lower your risk of heart disease. Improve your blood pressure. Reach or maintain a healthy weight. What can affect my meal plan? Every person with diabetes is different, and each person has different needs for a meal plan. Your health care provider may recommend that you work with a dietitian to make a meal plan that is best for you. Your meal plan may vary depending on factors such as: The calories you need. The medicines you take. Your weight. Your blood glucose, blood pressure, and cholesterol levels. Your activity level. Other health conditions you have, such as heart or kidney disease. How do carbohydrates affect me? Carbohydrates, also called carbs, affect your blood glucose level more than any other type of food. Eating carbs raises the amount of glucose in your blood. It is important to know how many carbs you can safely have in each meal. This is different for every person. Your dietitian can help you calculate how many carbs you should have at each meal and for each snack. How does alcohol affect me? Alcohol can cause a decrease in blood glucose (hypoglycemia), especially if you use insulin or take certain diabetes medicines by mouth. Hypoglycemia can be a life-threatening condition. Symptoms of hypoglycemia, such as sleepiness, dizziness, and confusion, are  similar to symptoms of having too much alcohol. Do not drink alcohol if: Your health care provider tells you not to drink. You are pregnant, may be pregnant, or are planning to become pregnant. If you drink alcohol: Limit how much you have to: 0-1 drink a day for women. 0-2 drinks a day for men. Know how much alcohol is in your drink. In the [place] one drink equals one 12 oz bottle of beer (355 mL), one 5 oz glass of wine (148 mL), or one 1 oz glass of hard liquor (44 mL). Keep yourself hydrated with water, diet soda, or unsweetened iced tea. Keep in mind that regular soda, juice, and other mixers may contain a lot of sugar and must be counted as carbs. What are tips for following this plan? Reading food labels Start by checking the serving size on the Nutrition Facts label of packaged foods and drinks. The number of calories and the amount of carbs, fats, and other nutrients listed on the label are based on one serving of the item. Many items contain more than one serving per package. Check the total grams (g) of carbs in one serving. Check the number of grams of saturated fats and trans fats in one serving. Choose foods that have a low amount or none of these fats. Check the number of milligrams (mg) of salt (sodium) in one serving. Most people should limit total sodium intake to less than 2,300 mg per day. Always check the nutrition information of foods labeled as "low-fat" or "nonfat." These foods may be higher in added sugar or refined  carbs and should be avoided. Talk to your dietitian to identify your daily goals for nutrients listed on the label. Shopping Avoid buying canned, pre-made, or processed foods. These foods tend to be high in fat, sodium, and added sugar. Shop around the outside edge of the grocery store. This is where you will most often find fresh fruits and vegetables, bulk grains, fresh meats, and fresh dairy products. Cooking Use low-heat cooking methods, such as baking,  instead of high-heat cooking methods, such as deep frying. Cook using healthy oils, such as olive, canola, or sunflower oil. Avoid cooking with butter, cream, or high-fat meats. Meal planning Eat meals and snacks regularly, preferably at the same times every day. Avoid going long periods of time without eating. Eat foods that are high in fiber, such as fresh fruits, vegetables, beans, and whole grains. Eat 4-6 oz (112-168 g) of lean protein each day, such as lean meat, chicken, fish, eggs, or tofu. One ounce (oz) (28 g) of lean protein is equal to: 1 oz (28 g) of meat, chicken, or fish. 1 egg.  cup (62 g) of tofu. Eat some foods each day that contain healthy fats, such as avocado, nuts, seeds, and fish. What foods should I eat? Fruits Berries. Apples. Oranges. Peaches. Apricots. Plums. Grapes. Mangoes. Papayas. Pomegranates. Kiwi. Cherries. Vegetables Leafy greens, including lettuce, spinach, kale, chard, collard greens, mustard greens, and cabbage. Beets. Cauliflower. Broccoli. Carrots. Green beans. Tomatoes. Peppers. Onions. Cucumbers. Brussels sprouts. Grains Whole grains, such as whole-wheat or whole-grain bread, crackers, tortillas, cereal, and pasta. Unsweetened oatmeal. Quinoa. Brown or wild rice. Meats and other proteins Seafood. Poultry without skin. Lean cuts of poultry and beef. Tofu. Nuts. Seeds. Dairy Low-fat or fat-free dairy products such as milk, yogurt, and cheese. The items listed above may not be a complete list of foods and beverages you can eat and drink. Contact a dietitian for more information. What foods should I avoid? Fruits Fruits canned with syrup. Vegetables Canned vegetables. Frozen vegetables with butter or cream sauce. Grains Refined white flour and flour products such as bread, pasta, snack foods, and cereals. Avoid all processed foods. Meats and other proteins Fatty cuts of meat. Poultry with skin. Breaded or fried meats. Processed meat. Avoid  saturated fats. Dairy Full-fat yogurt, cheese, or milk. Beverages Sweetened drinks, such as soda or iced tea. The items listed above may not be a complete list of foods and beverages you should avoid. Contact a dietitian for more information. Questions to ask a health care provider Do I need to meet with a certified diabetes care and education specialist? Do I need to meet with a dietitian? What number can I call if I have questions? When are the best times to check my blood glucose? Where to find more information: American Diabetes Association: diabetes.org Academy of Nutrition and Dietetics: eatright.[employer] of Diabetes and Digestive and Kidney Diseases: [employer] Association of Diabetes Care & Education Specialists: diabeteseducator.org Summary It is important to have healthy eating habits because your blood sugar (glucose) levels are greatly affected by what you eat and drink. It is important to use alcohol carefully. A healthy meal plan will help you manage your blood glucose and lower your risk of heart disease. Your health care provider may recommend that you work with a dietitian to make a meal plan that is best for you. This information is not intended to replace advice given to you by your health care provider. Make sure you discuss any questions you have with  your health care provider. Document Revised: 01/04/2020 Document Reviewed: 01/04/2020 Elsevier Patient Education  [place].

## 2021-06-06 NOTE — Assessment & Plan Note (Signed)
Increased, overall okay, but would like to see slightly lower. He agrees.  Will resume glipizide XL but at a lower dose of 2.5 mg daily. Continue metformin XR 1000 mg daily.  Repeat A1C in 3 months. Follow up in 6 months.

## 2021-06-06 NOTE — Progress Notes (Signed)
Subjective:    Patient ID: Johnathan Dominguez, male    DOB: 11-10-1950, 70 y.o.   MRN: [id number]  HPI  Johnathan Dominguez is a very pleasant 70 y.o. male with a history of type 2 diabetes, hypertension, BPH, hyperlipidemia who presents today for follow up of diabetes.  Current medications include: Glipizide XL 10 mg, metformin XR 1000 mg daily. He's not taken glipizide in about 1 year.   He is checking his blood glucose on occasion and is getting readings of 150.   Last A1C: 6.7 in June 2022, 7.2 today Last Eye Exam: UTD Last Foot Exam: UTD Pneumonia Vaccination: Unsure. He believes he had two vaccines in the past.  Urine Microalbumin: None. On ARB. Statin: Intolerant. On Zetia 10 mg.   Dietary changes since last visit: Unhealthy diet recently   Exercise: Some walking, active at home.    BP Readings from Last 3 Encounters:  06/06/21 130/80  12/05/20 124/76  12/13/19 124/84       Review of Systems  Eyes:  Negative for visual disturbance.  Cardiovascular:  Negative for chest pain.  Neurological:  Negative for dizziness, numbness and headaches.        Past Medical History:  Diagnosis Date   Allergic rhinitis    BPH (benign prostatic hyperplasia)    Essential hypertension    GAD (generalized anxiety disorder)    Hyperlipidemia    Osteoarthritis     Social History   Socioeconomic History   Marital status: Married    Spouse name: Not on file   Number of children: Not on file   Years of education: Not on file   Highest education level: Not on file  Occupational History   Not on file  Tobacco Use   Smoking status: Never   Smokeless tobacco: Never  Substance and Sexual Activity   Alcohol use: Not Currently   Drug use: Not on file   Sexual activity: Not on file  Other Topics Concern   Not on file  Social History Narrative   Not on file   Social Determinants of Health   Financial Resource Strain: Low Risk    Difficulty of Paying Living Expenses:  Not hard at all  Food Insecurity: No Food Insecurity   Worried About Running Out of Food in the Last Year: Never true   Ran Out of Food in the Last Year: Never true  Transportation Needs: No Transportation Needs   Lack of Transportation (Medical): No   Lack of Transportation (Non-Medical): No  Physical Activity: Inactive   Days of Exercise per Week: 0 days   Minutes of Exercise per Session: 0 min  Stress: No Stress Concern Present   Feeling of Stress : Not at all  Social Connections: Not on file  Intimate Partner Violence: Not At Risk   Fear of Current or Ex-Partner: No   Emotionally Abused: No   Physically Abused: No   Sexually Abused: No    Past Surgical History:  Procedure Laterality Date   CERVICAL SPINE SURGERY     GALLBLADDER SURGERY  1979   HERNIA REPAIR     TONSILLECTOMY AND ADENOIDECTOMY      Family History  Problem Relation Age of Onset   Heart disease Mother    Hypertension Mother    Heart disease Maternal Grandfather     Allergies  Allergen Reactions   Statins     Current Outpatient Medications on File Prior to Visit  Medication Sig Dispense Refill  amLODipine (NORVASC) 5 MG tablet Take 1 tablet by mouth once daily for blood pressure 90 tablet 2   BESIVANCE 0.6 % SUSP Place 1 drop into the right eye 3 (three) times daily.     buPROPion (WELLBUTRIN SR) 100 MG 12 hr tablet Take 1 tablet (100 mg total) by mouth 2 (two) times daily. For depression. 180 tablet 0   erythromycin ophthalmic ointment SMARTSIG:In Eye(s)     ezetimibe (ZETIA) 10 MG tablet TAKE 1 TABLET BY MOUTH ONCE DAILY FOR CHOLESTEROL 90 tablet 2   fluticasone (FLONASE) 50 MCG/ACT nasal spray Place 2 sprays into both nostrils daily.     glucose blood (ONETOUCH ULTRA) test strip Check sugar 2-3 times daily DX E11.9 100 each 5   Lancet Devices (ONE TOUCH DELICA LANCING DEV) MISC Use as instructed to test blood sugar daily 100 each 5   meloxicam (MOBIC) 15 MG tablet TAKE 1 TABLET BY MOUTH ONCE  DAILY AS NEEDED FOR PAIN 90 tablet 0   metFORMIN (GLUCOPHAGE-XR) 500 MG 24 hr tablet TAKE 2 TABLETS BY MOUTH ONCE DAILY WITH BREAKFAST FOR  DIABETES 180 tablet 1   tamsulosin (FLOMAX) 0.4 MG CAPS capsule TAKE 1 CAPSULE BY MOUTH ONCE DAILY AFTER  SUPPER  FOR  URINATION 90 capsule 3   valsartan (DIOVAN) 160 MG tablet Take 1 tablet by mouth once daily for blood pressure 90 tablet 3   venlafaxine XR (EFFEXOR-XR) 150 MG 24 hr capsule TAKE 1 CAPSULE BY MOUTH ONCE DAILY WITH BREAKFAST FOR ANXIETY 90 capsule 3   RESTASIS 0.05 % ophthalmic emulsion 1 drop 2 (two) times daily.     No current facility-administered medications on file prior to visit.    BP 130/80    Pulse 82    Temp 98.6 F (37 C) (Temporal)    Ht 5\' 6"  (1.676 m)    Wt 226 lb (102.5 kg)    SpO2 96%    BMI 36.48 kg/m  Objective:   Physical Exam Cardiovascular:     Rate and Rhythm: Normal rate and regular rhythm.  Pulmonary:     Effort: Pulmonary effort is normal.     Breath sounds: Normal breath sounds. No wheezing or rales.  Musculoskeletal:     Cervical back: Neck supple.  Skin:    General: Skin is warm and dry.  Neurological:     Mental Status: He is alert and oriented to person, place, and time.          Assessment & Plan:      This visit occurred during the SARS-CoV-2 public health emergency.  Safety protocols were in place, including screening questions prior to the visit, additional usage of staff PPE, and extensive cleaning of exam room while observing appropriate contact time as indicated for disinfecting solutions.

## 2021-06-19 LAB — HM DIABETES EYE EXAM

## 2021-06-20 ENCOUNTER — Encounter: Payer: Self-pay | Admitting: Primary Care

## 2021-07-09 IMAGING — DX DG ABDOMEN 2V
3 series · 4 of 4 positions shown · non-contrast
Comparison: None.

CLINICAL DATA: Constipation

EXAM:
ABDOMEN - 2 VIEW

[abdomen erect]
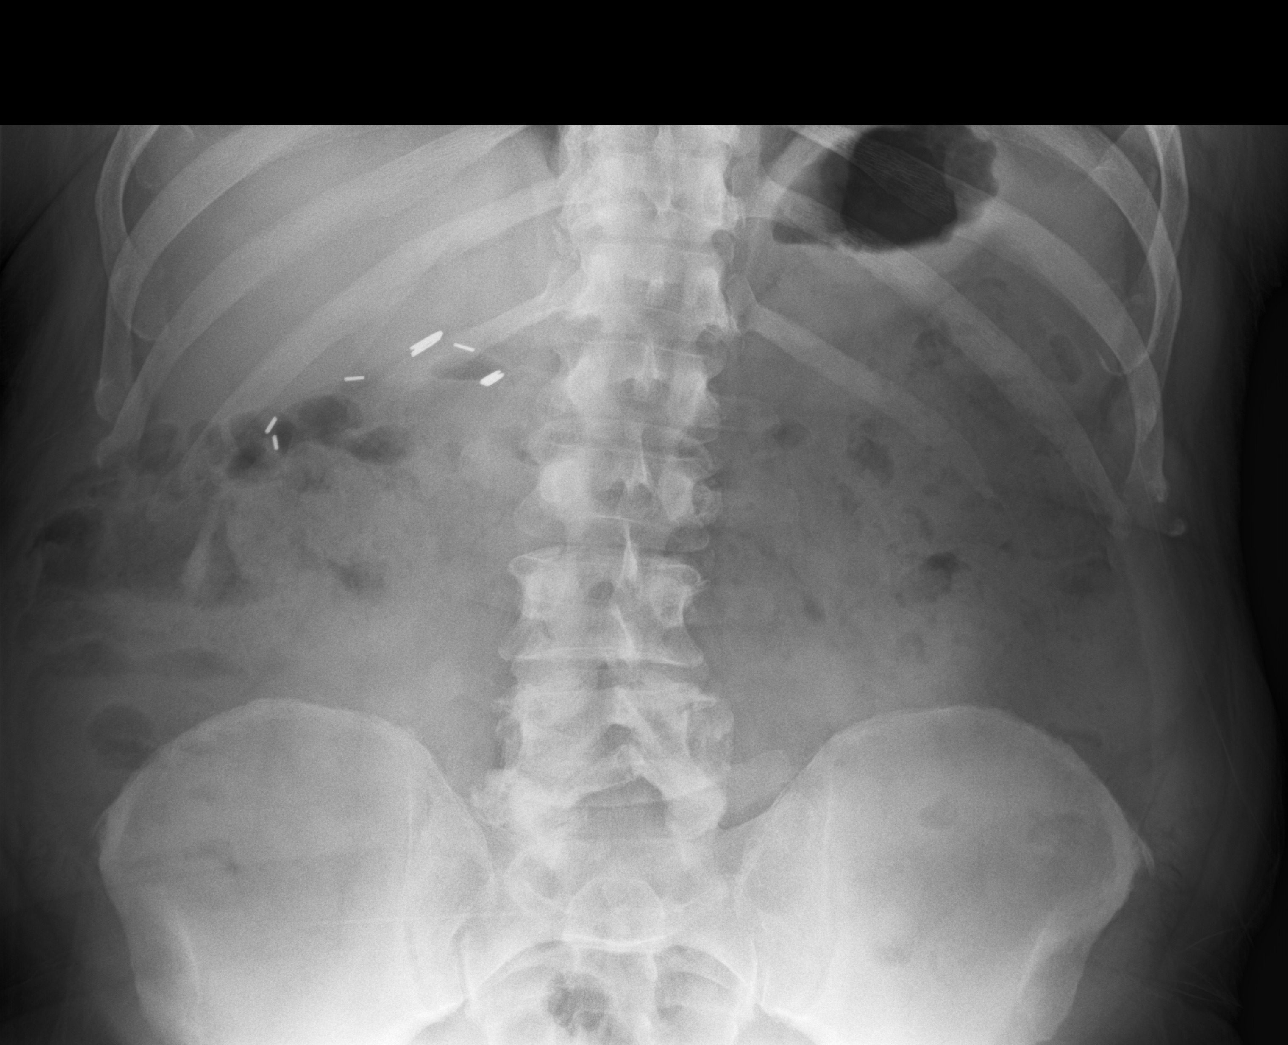

[abdomen supine (1 of 2)]
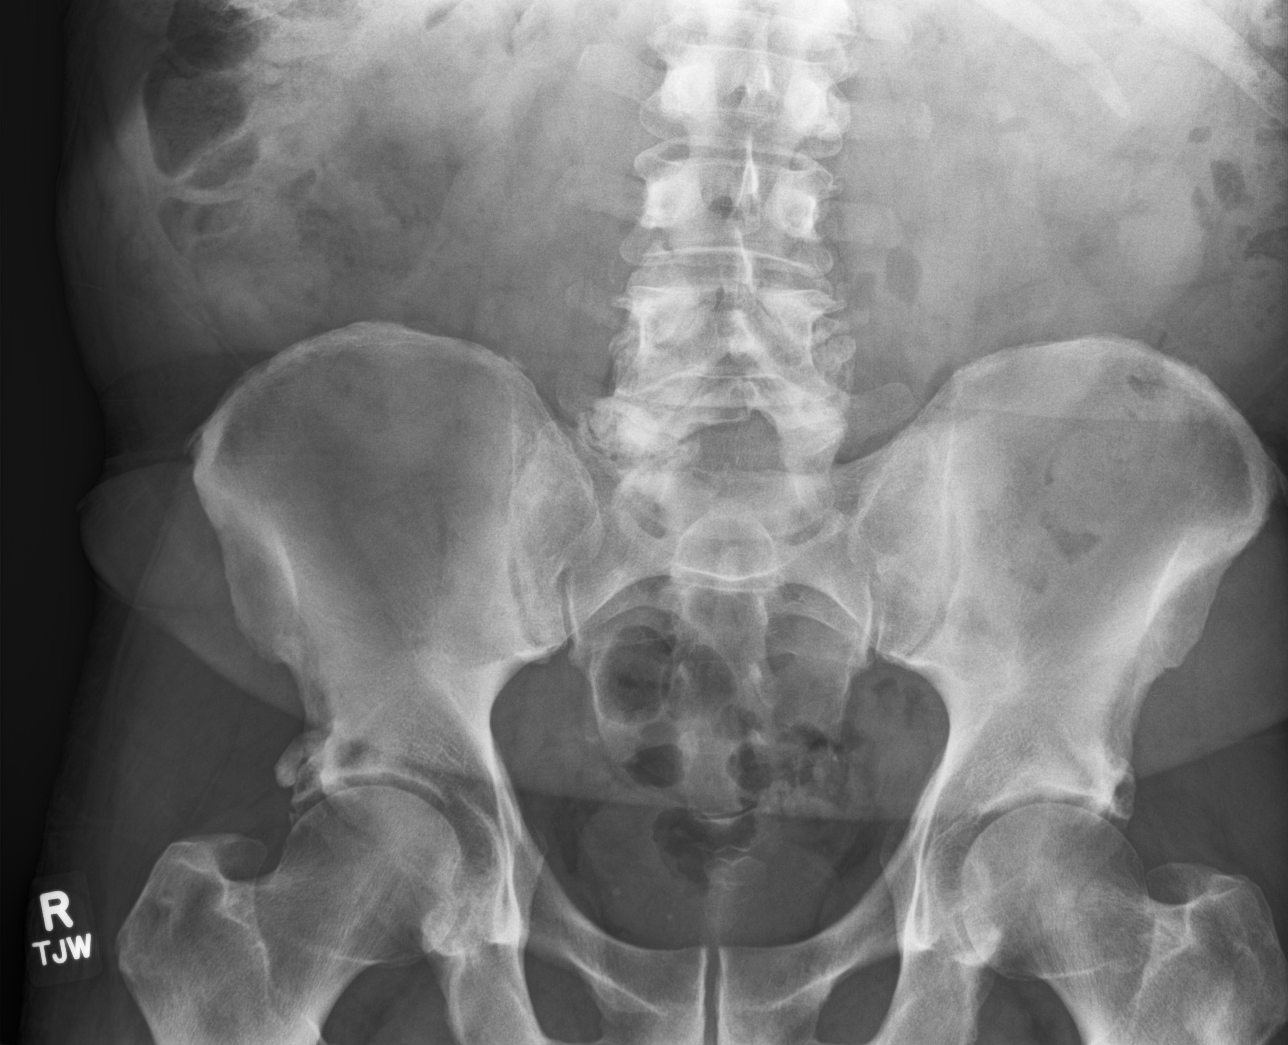

[Series 3: abdomen supine · 0.14mm/px · 2 of 2 slices shown (2 of 2)]
[im 1/2]
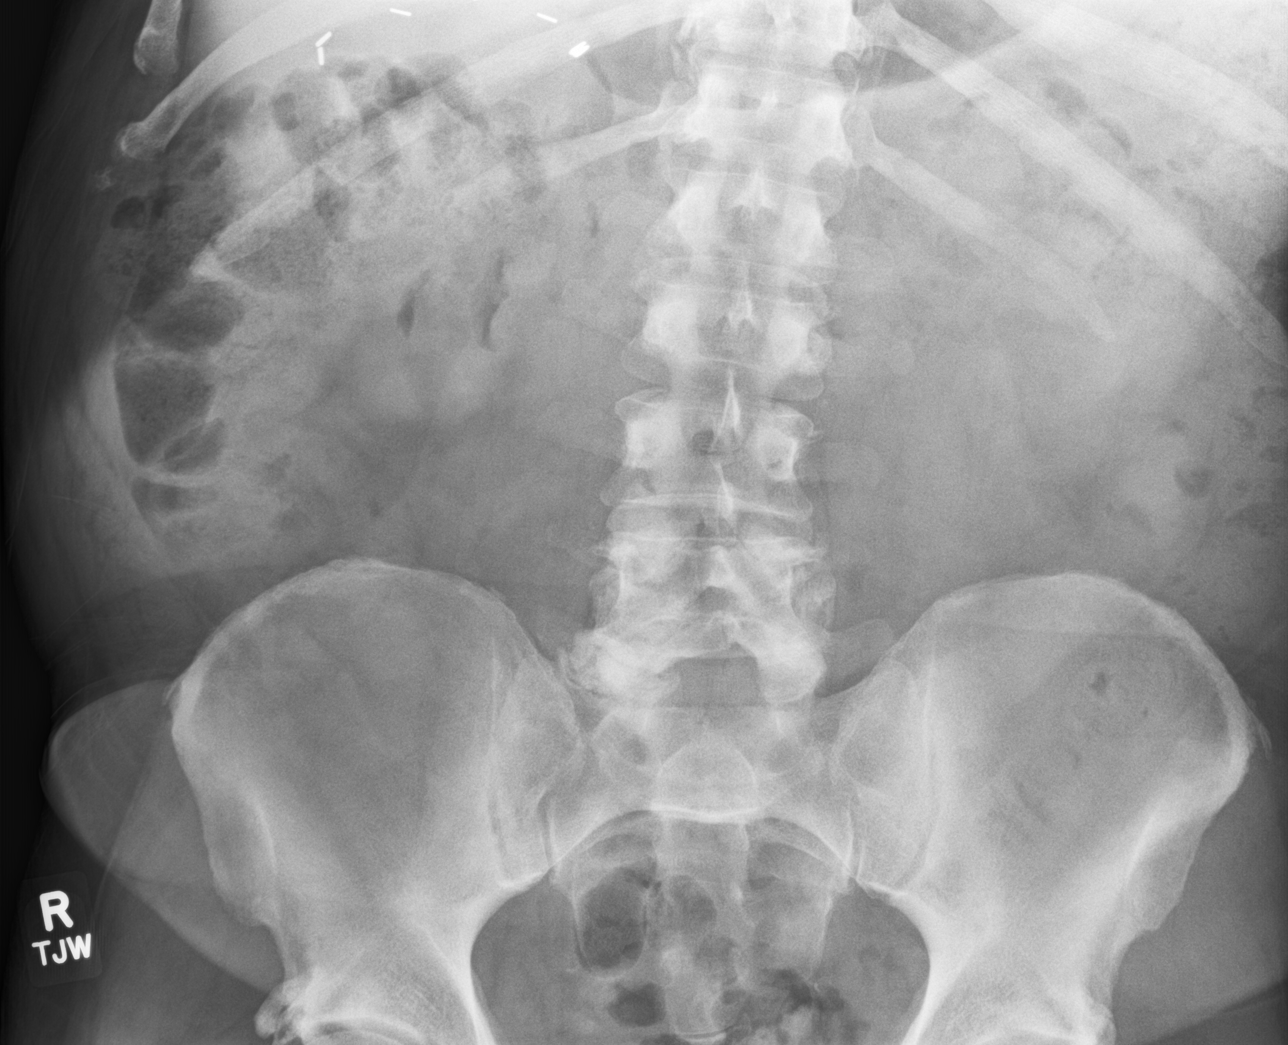
[im 2/2]
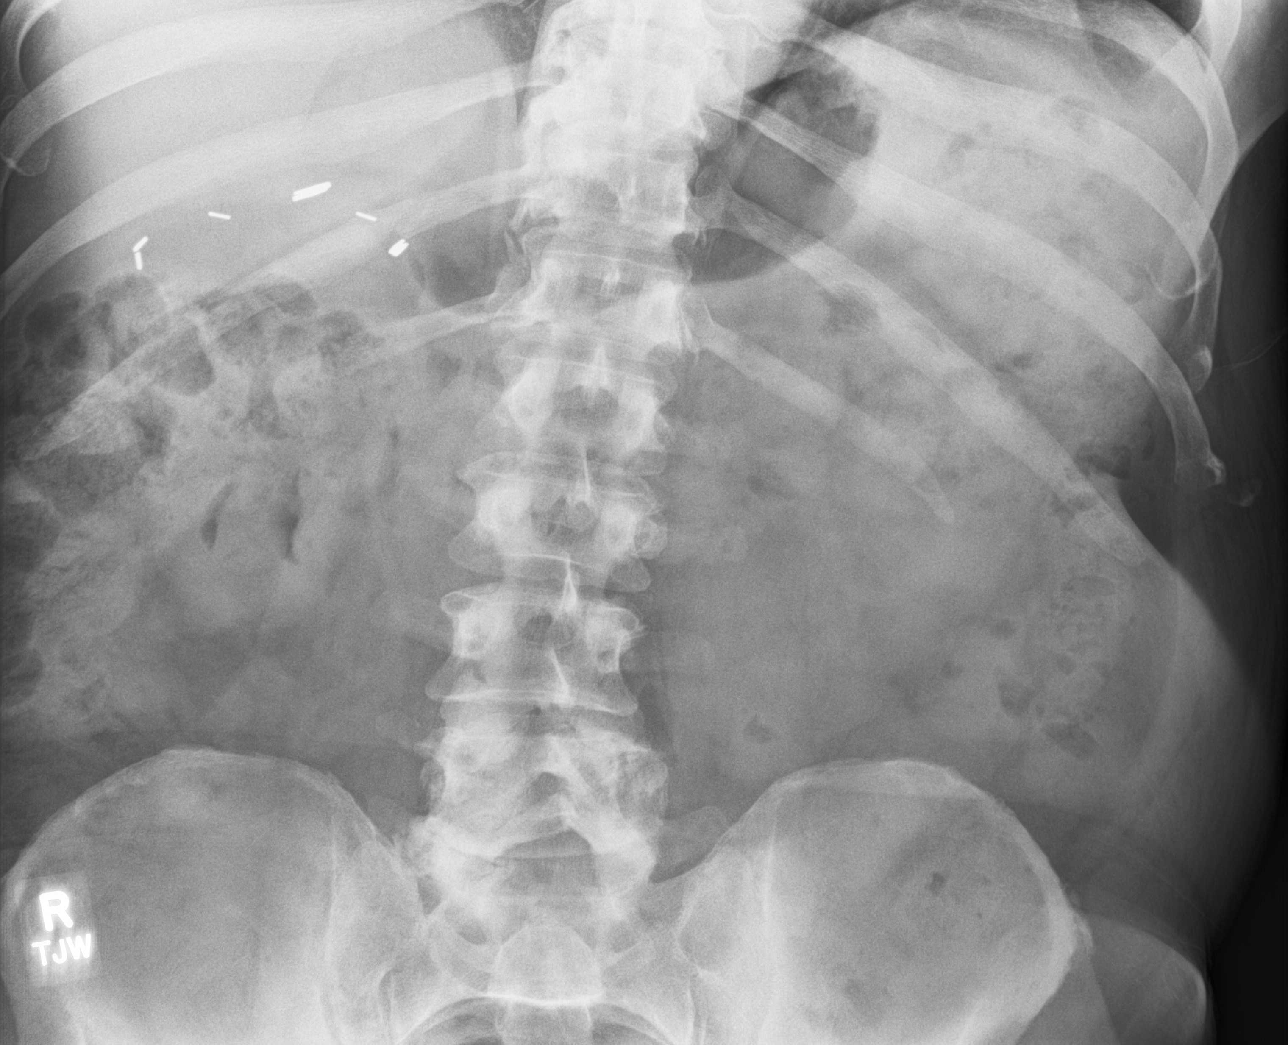

[4 of 4 positions shown; findings below may reference images not displayed]

FINDINGS: Supine and upright frontal views of the abdomen and pelvis
demonstrate an unremarkable bowel gas pattern. Mild fecal retention.
No masses or abnormal calcifications. No free gas in the greater
peritoneal sac. Lung bases are clear. There is prominent spondylosis
and facet hypertrophy at the lumbosacral junction. No acute bony
abnormalities.
IMPRESSION: 1. Unremarkable bowel gas pattern.
2. Mild fecal retention.

## 2021-07-17 ENCOUNTER — Telehealth (INDEPENDENT_AMBULATORY_CARE_PROVIDER_SITE_OTHER): Payer: Medicare Other | Admitting: Nurse Practitioner

## 2021-07-17 ENCOUNTER — Encounter: Payer: Self-pay | Admitting: Nurse Practitioner

## 2021-07-17 ENCOUNTER — Other Ambulatory Visit: Payer: Self-pay

## 2021-07-17 VITALS — HR 88 | Temp 97.1°F

## 2021-07-17 DIAGNOSIS — R051 Acute cough: Secondary | ICD-10-CM | POA: Insufficient documentation

## 2021-07-17 DIAGNOSIS — R432 Parageusia: Secondary | ICD-10-CM

## 2021-07-17 DIAGNOSIS — J069 Acute upper respiratory infection, unspecified: Secondary | ICD-10-CM | POA: Diagnosis not present

## 2021-07-17 DIAGNOSIS — R5383 Other fatigue: Secondary | ICD-10-CM

## 2021-07-17 HISTORY — DX: Parageusia: R43.2

## 2021-07-17 LAB — POC COVID19 BINAXNOW: SARS Coronavirus 2 Ag: NEGATIVE

## 2021-07-17 MED ORDER — DOXYCYCLINE HYCLATE 100 MG PO TABS: 100.0000 mg | ORAL_TABLET | Freq: Two times a day (BID) | ORAL | 0 refills | Status: AC

## 2021-07-17 NOTE — Assessment & Plan Note (Signed)
Rapid COVID test was negative.

## 2021-07-17 NOTE — Assessment & Plan Note (Signed)
Can use over-the-counter medications as needed.  Rapid COVID test was negative.

## 2021-07-17 NOTE — Assessment & Plan Note (Signed)
Since COVID test was negative and given patient's age, comorbidities, and length of symptoms will treat with doxycycline 100 mg twice daily for 7 days.  Signs and symptoms reviewed with patient when to seek urgent or emergent health care.  Follow-up if no improvement

## 2021-07-17 NOTE — Progress Notes (Signed)
Patient ID: Johnathan Dominguez, male    DOB: 1950/11/04, 71 y.o.   MRN: [id number]  Virtual visit completed through [employer], a video enabled telemedicine application. Due to national recommendations of social distancing due to COVID-19, a virtual visit is felt to be most appropriate for this patient at this time. Reviewed limitations, risks, security and privacy concerns of performing a virtual visit and the availability of in person appointments. I also reviewed that there may be a patient responsible charge related to this service. The patient agreed to proceed.   Patient location: home Provider location: [place] at [facility] office Persons participating in this virtual visit: patient, provider   If any vitals were documented, they were collected by patient at home unless specified below.    There were no vitals taken for this visit.   CC: Cough Subjective:   HPI: Johnathan Dominguez is a 72 y.o. male presenting on 07/17/2021 for Cough (Sx started about 1 week ago. Coughing up green phlegm, runny nose, slight headache, some chest tightness. No sore throat or fever. Covid test was not done. Has taking Mucinnex and Nyquil.)  Symptoms started 1 week ago No covid test Pfizer vaccines x2 Spouse sick first Mucinex and nyquill OTC and with little relief  Laying down make the cough worse Symptoms are getting worse    Relevant past medical, surgical, family and social history reviewed and updated as indicated. Interim medical history since our last visit reviewed. Allergies and medications reviewed and updated. Outpatient Medications Prior to Visit  Medication Sig Dispense Refill   amLODipine (NORVASC) 5 MG tablet Take 1 tablet by mouth once daily for blood pressure 90 tablet 2   BESIVANCE 0.6 % SUSP Place 1 drop into the right eye 3 (three) times daily.     buPROPion (WELLBUTRIN SR) 100 MG 12 hr tablet Take 1 tablet (100 mg total) by mouth 2 (two) times daily. For depression. 180  tablet 0   erythromycin ophthalmic ointment SMARTSIG:In Eye(s)     ezetimibe (ZETIA) 10 MG tablet TAKE 1 TABLET BY MOUTH ONCE DAILY FOR CHOLESTEROL 90 tablet 2   fluticasone (FLONASE) 50 MCG/ACT nasal spray Place 2 sprays into both nostrils daily.     glipiZIDE (GLUCOTROL XL) 2.5 MG 24 hr tablet Take 1 tablet (2.5 mg total) by mouth daily with breakfast. For diabetes. 90 tablet 1   glucose blood (ONETOUCH ULTRA) test strip Check sugar 2-3 times daily DX E11.9 100 each 5   Lancet Devices (ONE TOUCH DELICA LANCING DEV) MISC Use as instructed to test blood sugar daily 100 each 5   meloxicam (MOBIC) 15 MG tablet TAKE 1 TABLET BY MOUTH ONCE DAILY AS NEEDED FOR PAIN 90 tablet 0   metFORMIN (GLUCOPHAGE-XR) 500 MG 24 hr tablet TAKE 2 TABLETS BY MOUTH ONCE DAILY WITH BREAKFAST FOR  DIABETES 180 tablet 1   RESTASIS 0.05 % ophthalmic emulsion 1 drop 2 (two) times daily.     tamsulosin (FLOMAX) 0.4 MG CAPS capsule TAKE 1 CAPSULE BY MOUTH ONCE DAILY AFTER  SUPPER  FOR  URINATION 90 capsule 3   valsartan (DIOVAN) 160 MG tablet Take 1 tablet by mouth once daily for blood pressure 90 tablet 3   venlafaxine XR (EFFEXOR-XR) 150 MG 24 hr capsule TAKE 1 CAPSULE BY MOUTH ONCE DAILY WITH BREAKFAST FOR ANXIETY 90 capsule 3   No facility-administered medications prior to visit.     Per HPI unless specifically indicated in ROS section below Review of Systems  Constitutional:  Positive for fatigue. Negative for appetite change, chills and fever.  HENT:  Positive for postnasal drip and rhinorrhea. Negative for ear discharge, ear pain, sinus pressure, sinus pain and sore throat.   Respiratory:  Positive for cough (green). Negative for shortness of breath.   Cardiovascular:  Positive for chest pain (coughing).  Gastrointestinal:  Negative for abdominal pain, diarrhea, nausea and vomiting.  Musculoskeletal:  Negative for arthralgias and myalgias.  Neurological:  Positive for headaches.  Objective:  There were no vitals  taken for this visit.  Wt Readings from Last 3 Encounters:  06/06/21 226 lb (102.5 kg)  06/06/20 204 lb (92.5 kg)  12/13/19 204 lb 8 oz (92.8 kg)       Physical exam: Gen: alert, NAD, not ill appearing Pulm: speaks in complete sentences without increased work of breathing Psych: normal mood, normal thought content      Results for orders placed or performed in visit on 06/20/21  HM DIABETES EYE EXAM  Result Value Ref Range   HM Diabetic Eye Exam No Retinopathy No Retinopathy   Assessment & Plan:   Problem List Items Addressed This Visit       Respiratory   Upper respiratory tract infection    Since COVID test was negative and given patient's age, comorbidities, and length of symptoms will treat with doxycycline 100 mg twice daily for 7 days.  Signs and symptoms reviewed with patient when to seek urgent or emergent health care.  Follow-up if no improvement      Relevant Medications   doxycycline (VIBRA-TABS) 100 MG tablet     Other   Loss of taste - Primary    Rapid COVID test was negative.      Relevant Orders   POC COVID-19 (Completed)   Acute cough    Can use over-the-counter medications as needed.  Rapid COVID test was negative.      Relevant Orders   POC COVID-19 (Completed)   Other fatigue   Relevant Orders   POC COVID-19 (Completed)    Meds ordered this encounter  Medications   doxycycline (VIBRA-TABS) 100 MG tablet    Sig: Take 1 tablet (100 mg total) by mouth 2 (two) times daily for 7 days.    Dispense:  14 tablet    Refill:  0    Order Specific Question:   Supervising Provider    Answer:   TOWER, MARNE A [[id number]]   No orders of the defined types were placed in this encounter.   I discussed the assessment and treatment plan with the patient. The patient was provided an opportunity to ask questions and all were answered. The patient agreed with the plan and demonstrated an understanding of the instructions. The patient was advised to call back or  seek an in-person evaluation if the symptoms worsen or if the condition fails to improve as anticipated.  Follow up plan: No follow-ups on file.  Romilda Garret, NP

## 2021-08-23 ENCOUNTER — Other Ambulatory Visit: Payer: Self-pay | Admitting: Primary Care

## 2021-08-23 DIAGNOSIS — E119 Type 2 diabetes mellitus without complications: Secondary | ICD-10-CM

## 2021-08-23 DIAGNOSIS — M1990 Unspecified osteoarthritis, unspecified site: Secondary | ICD-10-CM

## 2021-09-02 ENCOUNTER — Other Ambulatory Visit: Payer: Self-pay | Admitting: Primary Care

## 2021-09-02 DIAGNOSIS — E119 Type 2 diabetes mellitus without complications: Secondary | ICD-10-CM

## 2021-09-05 ENCOUNTER — Other Ambulatory Visit (INDEPENDENT_AMBULATORY_CARE_PROVIDER_SITE_OTHER): Payer: Medicare Other

## 2021-09-05 ENCOUNTER — Other Ambulatory Visit: Payer: Self-pay

## 2021-09-05 DIAGNOSIS — E119 Type 2 diabetes mellitus without complications: Secondary | ICD-10-CM | POA: Diagnosis not present

## 2021-09-05 LAB — POCT GLYCOSYLATED HEMOGLOBIN (HGB A1C): Hemoglobin A1C: 7 % — AB (ref 4.0–5.6)

## 2021-10-11 ENCOUNTER — Other Ambulatory Visit: Payer: Self-pay | Admitting: Primary Care

## 2021-10-11 DIAGNOSIS — E119 Type 2 diabetes mellitus without complications: Secondary | ICD-10-CM

## 2021-11-14 ENCOUNTER — Ambulatory Visit (INDEPENDENT_AMBULATORY_CARE_PROVIDER_SITE_OTHER): Payer: Medicare Other | Admitting: Dermatology

## 2021-11-14 DIAGNOSIS — L570 Actinic keratosis: Secondary | ICD-10-CM | POA: Diagnosis not present

## 2021-11-14 DIAGNOSIS — L578 Other skin changes due to chronic exposure to nonionizing radiation: Secondary | ICD-10-CM | POA: Diagnosis not present

## 2021-11-14 DIAGNOSIS — D1724 Benign lipomatous neoplasm of skin and subcutaneous tissue of left leg: Secondary | ICD-10-CM | POA: Diagnosis not present

## 2021-11-14 DIAGNOSIS — D692 Other nonthrombocytopenic purpura: Secondary | ICD-10-CM | POA: Diagnosis not present

## 2021-11-14 DIAGNOSIS — L918 Other hypertrophic disorders of the skin: Secondary | ICD-10-CM | POA: Diagnosis not present

## 2021-11-14 DIAGNOSIS — L820 Inflamed seborrheic keratosis: Secondary | ICD-10-CM

## 2021-11-14 MED ORDER — FLUOROURACIL 5 % EX CREA: TOPICAL_CREAM | Freq: Two times a day (BID) | CUTANEOUS | 0 refills | Status: DC

## 2021-11-14 NOTE — Patient Instructions (Signed)

## 2021-11-14 NOTE — Progress Notes (Signed)
Follow-Up Visit   Subjective  Johnathan Dominguez is a 71 y.o. male who presents for the following: Actinic Keratosis (6 month follow up of face and scalp treated with LN2). The patient has spots, moles and lesions to be evaluated, some may be new or changing and the patient has concerns that these could be cancer.  The following portions of the chart were reviewed this encounter and updated as appropriate:   Tobacco  Allergies  Meds  Problems  Med Hx  Surg Hx  Fam Hx     Review of Systems:  No other skin or systemic complaints except as noted in HPI or Assessment and Plan.  Objective  Well appearing patient in no apparent distress; mood and affect are within normal limits.  A focused examination was performed including scalp, face, left leg, arms. Relevant physical exam findings are noted in the Assessment and Plan.  Scalp, face (20) Erythematous thin papules/macules with gritty scale.   Face (10) Erythematous stuck-on, waxy papule or plaque  Left Lower Leg - Anterior 2.7 x 1.0 cm firm rubbery papule  Groin area Fleshy, skin-colored pedunculated papules.     Assessment & Plan   Purpura - Chronic; persistent and recurrent.  Treatable, but not curable. - Violaceous macules and patches - Benign - Related to trauma, age, sun damage and/or use of blood thinners, chronic use of topical and/or oral steroids - Observe - Can use OTC arnica containing moisturizer such as Dermend Bruise Formula if desired - Call for worsening or other concerns  AK (actinic keratosis) (20) Scalp, face Actinic Damage - Severe, confluent actinic changes with pre-cancerous actinic keratoses  - Severe, chronic, not at goal, secondary to cumulative UV radiation exposure over time - diffuse scaly erythematous macules and papules with underlying dyspigmentation - Discussed Prescription "Field Treatment" for Severe, Chronic Confluent Actinic Changes with Pre-Cancerous Actinic Keratoses Field  treatment involves treatment of an entire area of skin that has confluent Actinic Changes (Sun/ Ultraviolet light damage) and PreCancerous Actinic Keratoses by method of PhotoDynamic Therapy (PDT) and/or prescription Topical Chemotherapy agents such as 5-fluorouracil, 5-fluorouracil/calcipotriene, and/or imiquimod.  The purpose is to decrease the number of clinically evident and subclinical PreCancerous lesions to prevent progression to development of skin cancer by chemically destroying early precancer changes that may or may not be visible.  It has been shown to reduce the risk of developing skin cancer in the treated area. As a result of treatment, redness, scaling, crusting, and open sores may occur during treatment course. One or more than one of these methods may be used and may have to be used several times to control, suppress and eliminate the PreCancerous changes. Discussed treatment course, expected reaction, and possible side effects. - Recommend daily broad spectrum sunscreen SPF 30+ to sun-exposed areas, reapply every 2 hours as needed.  - Staying in the shade or wearing long sleeves, sun glasses (UVA+UVB protection) and wide brim hats (4-inch brim around the entire circumference of the hat) are also recommended. - Call for new or changing lesions.  Start Fluorouracil 5%/Calcipotriene cream bid x 1 week to scalp and forehead  Destruction of lesion - Scalp, face x 20 Complexity: simple   Destruction method: cryotherapy   Informed consent: discussed and consent obtained   Timeout:  patient name, date of birth, surgical site, and procedure verified Lesion destroyed using liquid nitrogen: Yes   Region frozen until ice ball extended beyond lesion: Yes   Outcome: patient tolerated procedure well with no complications  Post-procedure details: wound care instructions given    fluorouracil (EFUDEX) 5 % cream - Scalp, face Apply topically 2 (two) times daily. X 1 week to scalp and  forehead  Inflamed seborrheic keratosis (10) Face Symptomatic, irritating, patient would like treated. Destruction of lesion - Face Complexity: simple   Destruction method: cryotherapy   Informed consent: discussed and consent obtained   Timeout:  patient name, date of birth, surgical site, and procedure verified Lesion destroyed using liquid nitrogen: Yes   Region frozen until ice ball extended beyond lesion: Yes   Outcome: patient tolerated procedure well with no complications   Post-procedure details: wound care instructions given    Lipoma of left lower extremity Left Lower Leg - Anterior Benign-appearing.  Observation.  Call clinic for new or changing lesions.  Recommend daily use of broad spectrum spf 30+ sunscreen to sun-exposed areas.   Skin tag Groin area Symptomatic, irritating, patient would like treated. Will plan snip excision on follow up  Actinic Damage - chronic, secondary to cumulative UV radiation exposure/sun exposure over time - diffuse scaly erythematous macules with underlying dyspigmentation - Recommend daily broad spectrum sunscreen SPF 30+ to sun-exposed areas, reapply every 2 hours as needed.  - Recommend staying in the shade or wearing long sleeves, sun glasses (UVA+UVB protection) and wide brim hats (4-inch brim around the entire circumference of the hat). - Call for new or changing lesions.  Return in about 3 months (around 02/14/2022) for AK follow up, skin tag removal.  I, Ashok Cordia, CMA, am acting as scribe for Sarina Ser, MD . Documentation: I have reviewed the above documentation for accuracy and completeness, and I agree with the above.  Sarina Ser, MD

## 2021-11-20 ENCOUNTER — Other Ambulatory Visit: Payer: Self-pay | Admitting: Primary Care

## 2021-11-20 DIAGNOSIS — M1990 Unspecified osteoarthritis, unspecified site: Secondary | ICD-10-CM

## 2021-11-20 DIAGNOSIS — I10 Essential (primary) hypertension: Secondary | ICD-10-CM

## 2021-11-24 ENCOUNTER — Encounter: Payer: Self-pay | Admitting: Dermatology

## 2021-11-27 ENCOUNTER — Other Ambulatory Visit: Payer: Self-pay | Admitting: Primary Care

## 2021-11-27 DIAGNOSIS — E119 Type 2 diabetes mellitus without complications: Secondary | ICD-10-CM

## 2021-11-29 ENCOUNTER — Ambulatory Visit (INDEPENDENT_AMBULATORY_CARE_PROVIDER_SITE_OTHER): Payer: Medicare Other

## 2021-11-29 VITALS — Ht 66.0 in | Wt 230.0 lb

## 2021-11-29 DIAGNOSIS — Z0000 Encounter for general adult medical examination without abnormal findings: Secondary | ICD-10-CM | POA: Diagnosis not present

## 2021-11-29 NOTE — Patient Instructions (Signed)
Mr. Johnathan Dominguez , Thank you for taking time to come for your Medicare Wellness Visit. I appreciate your ongoing commitment to your health goals. Please review the following plan we discussed and let me know if I can assist you in the future.   Screening recommendations/referrals: Colonoscopy: patient states due in 2025 Recommended yearly ophthalmology/optometry visit for glaucoma screening and checkup Recommended yearly dental visit for hygiene and checkup  Vaccinations: Influenza vaccine: due 01/14/2022 Pneumococcal vaccine: due Tdap vaccine: due Shingles vaccine: completed    Covid-19: 09/09/2019, 08/19/2019  Advanced directives: Advance directive discussed with you today.   Conditions/risks identified: none  Next appointment: Follow up in one year for your annual wellness visit.   Preventive Care 73 Years and Older, Male Preventive care refers to lifestyle choices and visits with your health care provider that can promote health and wellness. What does preventive care include? A yearly physical exam. This is also called an annual well check. Dental exams once or twice a year. Routine eye exams. Ask your health care provider how often you should have your eyes checked. Personal lifestyle choices, including: Daily care of your teeth and gums. Regular physical activity. Eating a healthy diet. Avoiding tobacco and drug use. Limiting alcohol use. Practicing safe sex. Taking low doses of aspirin every day. Taking vitamin and mineral supplements as recommended by your health care provider. What happens during an annual well check? The services and screenings done by your health care provider during your annual well check will depend on your age, overall health, lifestyle risk factors, and family history of disease. Counseling  Your health care provider may ask you questions about your: Alcohol use. Tobacco use. Drug use. Emotional well-being. Home and relationship well-being. Sexual  activity. Eating habits. History of falls. Memory and ability to understand (cognition). Work and work Statistician. Screening  You may have the following tests or measurements: Height, weight, and BMI. Blood pressure. Lipid and cholesterol levels. These may be checked every 5 years, or more frequently if you are over 57 years old. Skin check. Lung cancer screening. You may have this screening every year starting at age 65 if you have a 30-pack-year history of smoking and currently smoke or have quit within the past 15 years. Fecal occult blood test (FOBT) of the stool. You may have this test every year starting at age 74. Flexible sigmoidoscopy or colonoscopy. You may have a sigmoidoscopy every 5 years or a colonoscopy every 10 years starting at age 55. Prostate cancer screening. Recommendations will vary depending on your family history and other risks. Hepatitis C blood test. Hepatitis B blood test. Sexually transmitted disease (STD) testing. Diabetes screening. This is done by checking your blood sugar (glucose) after you have not eaten for a while (fasting). You may have this done every 1-3 years. Abdominal aortic aneurysm (AAA) screening. You may need this if you are a current or former smoker. Osteoporosis. You may be screened starting at age 23 if you are at high risk. Talk with your health care provider about your test results, treatment options, and if necessary, the need for more tests. Vaccines  Your health care provider may recommend certain vaccines, such as: Influenza vaccine. This is recommended every year. Tetanus, diphtheria, and acellular pertussis (Tdap, Td) vaccine. You may need a Td booster every 10 years. Zoster vaccine. You may need this after age 53. Pneumococcal 13-valent conjugate (PCV13) vaccine. One dose is recommended after age 58. Pneumococcal polysaccharide (PPSV23) vaccine. One dose is recommended after age  65. Talk to your health care provider about which  screenings and vaccines you need and how often you need them. This information is not intended to replace advice given to you by your health care provider. Make sure you discuss any questions you have with your health care provider. Document Released: 06/29/2015 Document Revised: 02/20/2016 Document Reviewed: 04/03/2015 Elsevier Interactive Patient Education  2017 [place] Prevention in the Home Falls can cause injuries. They can happen to people of all ages. There are many things you can do to make your home safe and to help prevent falls. What can I do on the outside of my home? Regularly fix the edges of walkways and driveways and fix any cracks. Remove anything that might make you trip as you walk through a door, such as a raised step or threshold. Trim any bushes or trees on the path to your home. Use bright outdoor lighting. Clear any walking paths of anything that might make someone trip, such as rocks or tools. Regularly check to see if handrails are loose or broken. Make sure that both sides of any steps have handrails. Any raised decks and porches should have guardrails on the edges. Have any leaves, snow, or ice cleared regularly. Use sand or salt on walking paths during winter. Clean up any spills in your garage right away. This includes oil or grease spills. What can I do in the bathroom? Use night lights. Install grab bars by the toilet and in the tub and shower. Do not use towel bars as grab bars. Use non-skid mats or decals in the tub or shower. If you need to sit down in the shower, use a plastic, non-slip stool. Keep the floor dry. Clean up any water that spills on the floor as soon as it happens. Remove soap buildup in the tub or shower regularly. Attach bath mats securely with double-sided non-slip rug tape. Do not have throw rugs and other things on the floor that can make you trip. What can I do in the bedroom? Use night lights. Make sure that you have a  light by your bed that is easy to reach. Do not use any sheets or blankets that are too big for your bed. They should not hang down onto the floor. Have a firm chair that has side arms. You can use this for support while you get dressed. Do not have throw rugs and other things on the floor that can make you trip. What can I do in the kitchen? Clean up any spills right away. Avoid walking on wet floors. Keep items that you use a lot in easy-to-reach places. If you need to reach something above you, use a strong step stool that has a grab bar. Keep electrical cords out of the way. Do not use floor polish or wax that makes floors slippery. If you must use wax, use non-skid floor wax. Do not have throw rugs and other things on the floor that can make you trip. What can I do with my stairs? Do not leave any items on the stairs. Make sure that there are handrails on both sides of the stairs and use them. Fix handrails that are broken or loose. Make sure that handrails are as long as the stairways. Check any carpeting to make sure that it is firmly attached to the stairs. Fix any carpet that is loose or worn. Avoid having throw rugs at the top or bottom of the stairs. If you do have  throw rugs, attach them to the floor with carpet tape. Make sure that you have a light switch at the top of the stairs and the bottom of the stairs. If you do not have them, ask someone to add them for you. What else can I do to help prevent falls? Wear shoes that: Do not have high heels. Have rubber bottoms. Are comfortable and fit you well. Are closed at the toe. Do not wear sandals. If you use a stepladder: Make sure that it is fully opened. Do not climb a closed stepladder. Make sure that both sides of the stepladder are locked into place. Ask someone to hold it for you, if possible. Clearly mark and make sure that you can see: Any grab bars or handrails. First and last steps. Where the edge of each step  is. Use tools that help you move around (mobility aids) if they are needed. These include: Canes. Walkers. Scooters. Crutches. Turn on the lights when you go into a dark area. Replace any light bulbs as soon as they burn out. Set up your furniture so you have a clear path. Avoid moving your furniture around. If any of your floors are uneven, fix them. If there are any pets around you, be aware of where they are. Review your medicines with your doctor. Some medicines can make you feel dizzy. This can increase your chance of falling. Ask your doctor what other things that you can do to help prevent falls. This information is not intended to replace advice given to you by your health care provider. Make sure you discuss any questions you have with your health care provider. Document Released: 03/29/2009 Document Revised: 11/08/2015 Document Reviewed: 07/07/2014 Elsevier Interactive Patient Education  2017 [employer].

## 2021-11-29 NOTE — Progress Notes (Signed)
I connected with Johnathan Dominguez today by telephone and verified that I am speaking with the correct person using two identifiers. Location patient: home Location provider: work Persons participating in the virtual visit: Iwao Shamblin, Glenna Durand LPN.   I discussed the limitations, risks, security and privacy concerns of performing an evaluation and management service by telephone and the availability of in person appointments. I also discussed with the patient that there may be a patient responsible charge related to this service. The patient expressed understanding and verbally consented to this telephonic visit.    Interactive audio and video telecommunications were attempted between this provider and patient, however failed, due to patient having technical difficulties OR patient did not have access to video capability.  We continued and completed visit with audio only.     Vital signs may be patient reported or missing.  Subjective:   Johnathan Dominguez is a 71 y.o. male who presents for Medicare Annual/Subsequent preventive examination.  Review of Systems     Cardiac Risk Factors include: advanced age (>39mn, >>15women);diabetes mellitus;dyslipidemia;hypertension;male gender;obesity (BMI >30kg/m2)     Objective:    Today's Vitals   11/29/21 1511  Weight: 230 lb (104.3 kg)  Height: '5\' 6"'$  (1.676 m)   Body mass index is 37.12 kg/m.     11/29/2021    3:16 PM 11/28/2020   11:18 AM  Advanced Directives  Does Patient Have a Medical Advance Directive? No No  Would patient like information on creating a medical advance directive?  No - Patient declined    Current Medications (verified) Outpatient Encounter Medications as of 11/29/2021  Medication Sig   amLODipine (NORVASC) 5 MG tablet Take 1 tablet (5 mg total) by mouth daily. for blood pressure. Office visit required for further refills.   BESIVANCE 0.6 % SUSP Place 1 drop into the right eye 3 (three) times daily.    buPROPion (WELLBUTRIN SR) 100 MG 12 hr tablet Take 1 tablet (100 mg total) by mouth 2 (two) times daily. For depression.   erythromycin ophthalmic ointment SMARTSIG:In Eye(s)   ezetimibe (ZETIA) 10 MG tablet TAKE 1 TABLET BY MOUTH ONCE DAILY FOR CHOLESTEROL   fluticasone (FLONASE) 50 MCG/ACT nasal spray Place 2 sprays into both nostrils daily.   glipiZIDE (GLUCOTROL XL) 2.5 MG 24 hr tablet TAKE 1 TABLET BY MOUTH ONCE DAILY WITH BREAKFAST FOR DIABETES   glucose blood (ONETOUCH ULTRA) test strip USE TO TEST BLOOD SUGAR TWO TO THREE TIMES DAILY   Lancet Devices (ONE TOUCH DELICA LANCING DEV) MISC Use as instructed to test blood sugar daily   meloxicam (MOBIC) 15 MG tablet Take 1 tablet (15 mg total) by mouth daily as needed for pain. Office visit required for further refills.   metFORMIN (GLUCOPHAGE-XR) 500 MG 24 hr tablet TAKE 2 TABLETS BY MOUTH ONCE DAILY WITH BREAKFAST FOR DIABETES   RESTASIS 0.05 % ophthalmic emulsion 1 drop 2 (two) times daily.   tamsulosin (FLOMAX) 0.4 MG CAPS capsule TAKE 1 CAPSULE BY MOUTH ONCE DAILY AFTER  SUPPER  FOR  URINATION   valsartan (DIOVAN) 160 MG tablet Take 1 tablet by mouth once daily for blood pressure   venlafaxine XR (EFFEXOR-XR) 150 MG 24 hr capsule TAKE 1 CAPSULE BY MOUTH ONCE DAILY WITH BREAKFAST FOR ANXIETY   fluorouracil (EFUDEX) 5 % cream Apply topically 2 (two) times daily. X 1 week to scalp and forehead (Patient not taking: Reported on 11/29/2021)   No facility-administered encounter medications on file as of 11/29/2021.  Allergies (verified) Statins   History: Past Medical History:  Diagnosis Date   Allergic rhinitis    BPH (benign prostatic hyperplasia)    Essential hypertension    GAD (generalized anxiety disorder)    Hyperlipidemia    Osteoarthritis    Past Surgical History:  Procedure Laterality Date   CERVICAL SPINE SURGERY     GALLBLADDER SURGERY  1979   HERNIA REPAIR     TONSILLECTOMY AND ADENOIDECTOMY     Family History   Problem Relation Age of Onset   Heart disease Mother    Hypertension Mother    Heart disease Maternal Grandfather    Social History   Socioeconomic History   Marital status: Married    Spouse name: Not on file   Number of children: Not on file   Years of education: Not on file   Highest education level: Not on file  Occupational History   Not on file  Tobacco Use   Smoking status: Never   Smokeless tobacco: Never  Vaping Use   Vaping Use: Never used  Substance and Sexual Activity   Alcohol use: Not Currently   Drug use: Never   Sexual activity: Not on file  Other Topics Concern   Not on file  Social History Narrative   Not on file   Social Determinants of Health   Financial Resource Strain: Low Risk  (11/29/2021)   Overall Financial Resource Strain (CARDIA)    Difficulty of Paying Living Expenses: Not hard at all  Food Insecurity: No Food Insecurity (11/29/2021)   Hunger Vital Sign    Worried About Running Out of Food in the Last Year: Never true    Ran Out of Food in the Last Year: Never true  Transportation Needs: No Transportation Needs (11/29/2021)   PRAPARE - Hydrologist (Medical): No    Lack of Transportation (Non-Medical): No  Physical Activity: Inactive (11/29/2021)   Exercise Vital Sign    Days of Exercise per Week: 0 days    Minutes of Exercise per Session: 0 min  Stress: No Stress Concern Present (11/29/2021)   [place]    Feeling of Stress : Not at all  Social Connections: Not on file    Tobacco Counseling Counseling given: Not Answered   Clinical Intake:  Pre-visit preparation completed: Yes  Pain : No/denies pain     Nutritional Status: BMI > 30  Obese Nutritional Risks: None Diabetes: Yes  How often do you need to have someone help you when you read instructions, pamphlets, or other written materials from your doctor or pharmacy?: 1 -  Never  Diabetic? Yes Nutrition Risk Assessment:  Has the patient had any N/V/D within the last 2 months?  No  Does the patient have any non-healing wounds?  No  Has the patient had any unintentional weight loss or weight gain?  No   Diabetes:  Is the patient diabetic?  Yes  If diabetic, was a CBG obtained today?  No  Did the patient bring in their glucometer from home?  No  How often do you monitor your CBG's? daily.   Financial Strains and Diabetes Management:  Are you having any financial strains with the device, your supplies or your medication? No .  Does the patient want to be seen by Chronic Care Management for management of their diabetes?  No  Would the patient like to be referred to a Nutritionist or for Diabetic  Management?  No   Diabetic Exams:  Diabetic Eye Exam: Completed 06/19/2021 Diabetic Foot Exam: Completed 12/05/2020   Interpreter Needed?: No  Information entered by :: NAllen LPN   Activities of Daily Living    11/29/2021    3:17 PM 12/05/2020    8:13 AM  In your present state of health, do you have any difficulty performing the following activities:  Hearing? 0 1  Comment  right ear  Vision? 0 0  Difficulty concentrating or making decisions? 0 0  Walking or climbing stairs? 0 0  Dressing or bathing? 0 0  Doing errands, shopping? 0 0  Preparing Food and eating ? N   Using the Toilet? N   In the past six months, have you accidently leaked urine? N   Do you have problems with loss of bowel control? N   Managing your Medications? N   Managing your Finances? N   Housekeeping or managing your Housekeeping? N     Patient Care Team: Pleas Koch, NP as PCP - General (Internal Medicine) Dwain Sarna, [place] as Referring Physician (Optometry)  Indicate any recent Medical Services you may have received from other than [facility] providers in the past year (date may be approximate).     Assessment:   This is a routine wellness examination for  Johnathan Dominguez.  Hearing/Vision screen Vision Screening - Comments:: Regular eye exams, Dr. Ellin Mayhew  Dietary issues and exercise activities discussed: Current Exercise Habits: The patient does not participate in regular exercise at present   Goals Addressed             This Visit's Progress    Patient Stated       11/29/2021, stay alive       Depression Screen    11/29/2021    3:17 PM 06/06/2021    8:14 AM 12/05/2020    8:12 AM 11/28/2020   11:19 AM 06/06/2020    8:02 AM  PHQ 2/9 Scores  PHQ - 2 Score 0 0 0 0 0  PHQ- 9 Score  0 1 0 0    Fall Risk    11/29/2021    3:16 PM 11/28/2020   11:18 AM 06/06/2020    8:01 AM  [place] in the past year? 0 0 0  Number falls in past yr: 0 0 0  Injury with Fall? 0 0 0  Risk for fall due to : Medication side effect Medication side effect   Follow up Falls evaluation completed;Education provided;Falls prevention discussed Falls evaluation completed;Falls prevention discussed     FALL RISK PREVENTION PERTAINING TO THE HOME:  Any stairs in or around the home? Yes  If so, are there any without handrails? No  Home free of loose throw rugs in walkways, pet beds, electrical cords, etc? Yes  Adequate lighting in your home to reduce risk of falls? Yes   ASSISTIVE DEVICES UTILIZED TO PREVENT FALLS:  Life alert? No  Use of a cane, walker or w/c? No  Grab bars in the bathroom? No  Shower chair or bench in shower? No  Elevated toilet seat or a handicapped toilet? Yes   TIMED UP AND GO:  Was the test performed? No .      Cognitive Function:    11/28/2020   11:20 AM  MMSE - Mini Mental State Exam  Not completed: Refused        11/29/2021    3:18 PM  6CIT Screen  What Year? 0 points  What month? 0 points  What time? 0 points  Count back from 20 0 points  Months in reverse 0 points  Repeat phrase 6 points  Total Score 6 points    Immunizations Immunization History  Administered Date(s) Administered    Influenza-Unspecified 05/05/2020, 04/16/2021   PFIZER(Purple Top)SARS-COV-2 Vaccination 08/19/2019, 09/09/2019   Zoster Recombinat (Shingrix) 12/15/2017, 05/03/2018    TDAP status: Due, Education has been provided regarding the importance of this vaccine. Advised may receive this vaccine at local pharmacy or Health Dept. Aware to provide a copy of the vaccination record if obtained from local pharmacy or Health Dept. Verbalized acceptance and understanding.  Flu Vaccine status: Up to date  Pneumococcal vaccine status: Due, Education has been provided regarding the importance of this vaccine. Advised may receive this vaccine at local pharmacy or Health Dept. Aware to provide a copy of the vaccination record if obtained from local pharmacy or Health Dept. Verbalized acceptance and understanding.  Covid-19 vaccine status: Completed vaccines  Qualifies for Shingles Vaccine? Yes   Zostavax completed Yes   Shingrix Completed?: Yes  Screening Tests Health Maintenance  Topic Date Due   TETANUS/TDAP  Never done   COLONOSCOPY (Pts 45-69yr Insurance coverage will need to be confirmed)  Never done   Pneumonia Vaccine 73 Years old (1 - PCV) Never done   FOOT EXAM  12/05/2021   INFLUENZA VACCINE  01/14/2022   HEMOGLOBIN A1C  03/08/2022   OPHTHALMOLOGY EXAM  06/19/2022   Hepatitis C Screening  Completed   Zoster Vaccines- Shingrix  Completed   HPV VACCINES  Aged Out   COVID-19 Vaccine  Discontinued    Health Maintenance  Health Maintenance Due  Topic Date Due   TETANUS/TDAP  Never done   COLONOSCOPY (Pts 45-489yrInsurance coverage will need to be confirmed)  Never done   Pneumonia Vaccine 6577Years old (1 - PCV) Never done    Colorectal cancer screening: patient states due in 2025  Lung Cancer Screening: (Low Dose CT Chest recommended if Age 73-80ears, 30 pack-year currently smoking OR have quit w/in 15years.) does not qualify.   Lung Cancer Screening Referral: no  Additional  Screening:  Hepatitis C Screening: does qualify; Completed 12/05/2020  Vision Screening: Recommended annual ophthalmology exams for early detection of glaucoma and other disorders of the eye. Is the patient up to date with their annual eye exam?  Yes  Who is the provider or what is the name of the office in which the patient attends annual eye exams? Dr. WoEllin Mayhewf pt is not established with a provider, would they like to be referred to a provider to establish care? No .   Dental Screening: Recommended annual dental exams for proper oral hygiene  Community Resource Referral / Chronic Care Management: CRR required this visit?  No   CCM required this visit?  No      Plan:     I have personally reviewed and noted the following in the patient's chart:   Medical and social history Use of alcohol, tobacco or illicit drugs  Current medications and supplements including opioid prescriptions. Patient is not currently taking opioid prescriptions. Functional ability and status Nutritional status Physical activity Advanced directives List of other physicians Hospitalizations, surgeries, and ER visits in previous 12 months Vitals Screenings to include cognitive, depression, and falls Referrals and appointments  In addition, I have reviewed and discussed with patient certain preventive protocols, quality metrics, and best practice recommendations. A written personalized care plan for preventive  services as well as general preventive health recommendations were provided to patient.     Kellie Simmering, LPN   01/16/2335   Nurse Notes: none  Due to this being a virtual visit, the after visit summary with patients personalized plan was offered to patient via mail or my-chart. Patient would like to access on my-chart

## 2021-12-05 ENCOUNTER — Ambulatory Visit: Payer: Medicare Other | Admitting: Primary Care

## 2021-12-06 ENCOUNTER — Other Ambulatory Visit: Payer: Self-pay | Admitting: Primary Care

## 2021-12-06 DIAGNOSIS — I10 Essential (primary) hypertension: Secondary | ICD-10-CM

## 2021-12-18 LAB — HM DIABETES EYE EXAM

## 2021-12-19 ENCOUNTER — Ambulatory Visit (INDEPENDENT_AMBULATORY_CARE_PROVIDER_SITE_OTHER): Payer: Medicare Other | Admitting: Primary Care

## 2021-12-19 ENCOUNTER — Encounter: Payer: Self-pay | Admitting: Primary Care

## 2021-12-19 VITALS — BP 106/62 | HR 70 | Temp 98.6°F | Ht 66.0 in | Wt 232.0 lb

## 2021-12-19 DIAGNOSIS — I10 Essential (primary) hypertension: Secondary | ICD-10-CM

## 2021-12-19 DIAGNOSIS — E785 Hyperlipidemia, unspecified: Secondary | ICD-10-CM

## 2021-12-19 DIAGNOSIS — Z125 Encounter for screening for malignant neoplasm of prostate: Secondary | ICD-10-CM

## 2021-12-19 DIAGNOSIS — E1165 Type 2 diabetes mellitus with hyperglycemia: Secondary | ICD-10-CM | POA: Diagnosis not present

## 2021-12-19 DIAGNOSIS — F411 Generalized anxiety disorder: Secondary | ICD-10-CM

## 2021-12-19 DIAGNOSIS — N400 Enlarged prostate without lower urinary tract symptoms: Secondary | ICD-10-CM

## 2021-12-19 DIAGNOSIS — M1990 Unspecified osteoarthritis, unspecified site: Secondary | ICD-10-CM

## 2021-12-19 DIAGNOSIS — F331 Major depressive disorder, recurrent, moderate: Secondary | ICD-10-CM

## 2021-12-19 LAB — LIPID PANEL
Cholesterol: 156 mg/dL (ref 0–200)
HDL: 35.6 mg/dL — ABNORMAL LOW (ref >39.00)
LDL Cholesterol: 86 mg/dL (ref 0–99)
NonHDL: 120.54
Total CHOL/HDL Ratio: 4
Triglycerides: 175 mg/dL — ABNORMAL HIGH (ref 0.0–149.0)
VLDL: 35 mg/dL (ref 0.0–40.0)

## 2021-12-19 LAB — COMPREHENSIVE METABOLIC PANEL
ALT: 47 U/L (ref 0–53)
AST: 32 U/L (ref 0–37)
Albumin: 4.4 g/dL (ref 3.5–5.2)
Alkaline Phosphatase: 56 U/L (ref 39–117)
BUN: 16 mg/dL (ref 6–23)
CO2: 26 mEq/L (ref 19–32)
Calcium: 9.3 mg/dL (ref 8.4–10.5)
Chloride: 103 mEq/L (ref 96–112)
Creatinine, Ser: 1.06 mg/dL (ref 0.40–1.50)
GFR: 70.08 mL/min (ref >60.00)
Glucose, Bld: 128 mg/dL — ABNORMAL HIGH (ref 70–99)
Potassium: 4 mEq/L (ref 3.5–5.1)
Sodium: 136 mEq/L (ref 135–145)
Total Bilirubin: 0.5 mg/dL (ref 0.2–1.2)
Total Protein: 6.7 g/dL (ref 6.0–8.3)

## 2021-12-19 LAB — PSA, MEDICARE: PSA: 4.63 ng/ml — ABNORMAL HIGH (ref 0.10–4.00)

## 2021-12-19 LAB — CBC
HCT: 40.6 % (ref 39.0–52.0)
Hemoglobin: 13.6 g/dL (ref 13.0–17.0)
MCHC: 33.5 g/dL (ref 30.0–36.0)
MCV: 86.7 fl (ref 78.0–100.0)
Platelets: 227 10*3/uL (ref 150.0–400.0)
RBC: 4.68 Mil/uL (ref 4.22–5.81)
RDW: 13.4 % (ref 11.5–15.5)
WBC: 6.7 10*3/uL (ref 4.0–10.5)

## 2021-12-19 LAB — HEMOGLOBIN A1C: Hgb A1c MFr Bld: 7.1 % — ABNORMAL HIGH (ref 4.6–6.5)

## 2021-12-19 MED ORDER — VENLAFAXINE HCL ER 150 MG PO CP24: ORAL_CAPSULE | ORAL | 3 refills | Status: DC

## 2021-12-19 MED ORDER — TAMSULOSIN HCL 0.4 MG PO CAPS: ORAL_CAPSULE | ORAL | 3 refills | Status: DC

## 2021-12-19 MED ORDER — EZETIMIBE 10 MG PO TABS: 10.0000 mg | ORAL_TABLET | Freq: Every day | ORAL | 3 refills | Status: DC

## 2021-12-19 NOTE — Assessment & Plan Note (Signed)
Controlled.  Continue meloxicam 15 mg daily. Renal function pending.

## 2021-12-19 NOTE — Assessment & Plan Note (Signed)
Repeat lipid panel pending today.  Continue Zetia 10 mg daily.

## 2021-12-19 NOTE — Assessment & Plan Note (Addendum)
Controlled.   Continue amlodipine 5 mg daily, valsartan 160 mg daily.  CMP pending.

## 2021-12-19 NOTE — Progress Notes (Signed)
Subjective:    Patient ID: Johnathan Dominguez, male    DOB: Nov 22, 1950, 71 y.o.   MRN: [id number]  HPI  Meade Hogeland is a very pleasant 71 y.o. male with a history of hypertension, type 2 diabetes, osteoarthritis, BPH, hyperlipidemia, GAD, MDD, chronic thoracic back pain who presents today for follow-up of chronic conditions.  Immunizations: -Influenza: Completed last season  -Covid-19: 2 vaccines  -Shingles: Completed Shingrix -Pneumonia: Completed 2 vaccines per patient, no records.  Diet: Fair diet.  Exercise: No regular exercise.  Eye exam: Completes annually  Dental exam: Completes semi-annually   Colonoscopy: Completed in 2015, due 2025 per patient  PSA: Due  1) Essential Hypertension/Hyperlipidemia: Currently managed on amlodipine 5 mg daily, valsartan 160 mg daily, Zetia 10 mg daily.   He denies chest pain, dizziness, shortness of breath. He does have occasional headaches.   BP Readings from Last 3 Encounters:  12/19/21 106/62  06/06/21 130/80  12/05/20 124/76   2) Anxiety and Depression: Currently managed on venlafaxine ER 150 mg daily, bupropion SR 100 mg twice daily. Today he's been off of bupropion SR 100 mg BID for months, is doing well off of this medication.   3) BPH: Currently managed on tamsulosin 0.4 mg daily. He is doing well on this regimen. He denies difficulty urinating, hematuria and nocturia.   4) Type 2 Diabetes:  Current medications include: Glipizide XL 2.5 mg daily, metformin XR 1000 mg daily.  He is checking his blood glucose 1 times weekly and is getting readings of 150's  Last A1C: 7.0 in March 2023 Last Eye Exam: UTD Last Foot Exam: Due Pneumonia Vaccination: UTD per patient  Urine Microalbumin: None. Managed on ARB Statin: None. Intolerant   Dietary changes since last visit: None.   Exercise: None. Active at home.      Review of Systems  Respiratory:  Negative for shortness of breath.   Cardiovascular:  Negative  for chest pain.  Gastrointestinal:  Negative for constipation and diarrhea.  Genitourinary:  Negative for difficulty urinating.  Neurological:  Positive for headaches. Negative for dizziness and numbness.  Psychiatric/Behavioral:  The patient is not nervous/anxious.          Past Medical History:  Diagnosis Date   Acute constipation 11/18/2019   Allergic rhinitis    BPH (benign prostatic hyperplasia)    Essential hypertension    GAD (generalized anxiety disorder)    Hyperlipidemia    Osteoarthritis     Social History   Socioeconomic History   Marital status: Married    Spouse name: Not on file   Number of children: Not on file   Years of education: Not on file   Highest education level: Not on file  Occupational History   Not on file  Tobacco Use   Smoking status: Never   Smokeless tobacco: Never  Vaping Use   Vaping Use: Never used  Substance and Sexual Activity   Alcohol use: Not Currently   Drug use: Never   Sexual activity: Not on file  Other Topics Concern   Not on file  Social History Narrative   Not on file   Social Determinants of Health   Financial Resource Strain: Low Risk  (11/29/2021)   Overall Financial Resource Strain (CARDIA)    Difficulty of Paying Living Expenses: Not hard at all  Food Insecurity: No Food Insecurity (11/29/2021)   Hunger Vital Sign    Worried About Running Out of Food in the Last Year: Never true  Ran Out of Food in the Last Year: Never true  Transportation Needs: No Transportation Needs (11/29/2021)   PRAPARE - Hydrologist (Medical): No    Lack of Transportation (Non-Medical): No  Physical Activity: Inactive (11/29/2021)   Exercise Vital Sign    Days of Exercise per Week: 0 days    Minutes of Exercise per Session: 0 min  Stress: No Stress Concern Present (11/29/2021)   [place]    Feeling of Stress : Not at all  Social  Connections: Not on file  Intimate Partner Violence: Not At Risk (11/28/2020)   Humiliation, Afraid, Rape, and Kick questionnaire    Fear of Current or Ex-Partner: No    Emotionally Abused: No    Physically Abused: No    Sexually Abused: No    Past Surgical History:  Procedure Laterality Date   CERVICAL SPINE SURGERY     GALLBLADDER SURGERY  1979   HERNIA REPAIR     TONSILLECTOMY AND ADENOIDECTOMY      Family History  Problem Relation Age of Onset   Heart disease Mother    Hypertension Mother    Heart disease Maternal Grandfather     Allergies  Allergen Reactions   Statins     Current Outpatient Medications on File Prior to Visit  Medication Sig Dispense Refill   amLODipine (NORVASC) 5 MG tablet Take 1 tablet (5 mg total) by mouth daily. for blood pressure. Office visit required for further refills. 60 tablet 0   BESIVANCE 0.6 % SUSP Place 1 drop into the right eye 3 (three) times daily.     fluticasone (FLONASE) 50 MCG/ACT nasal spray Place 2 sprays into both nostrils daily.     glipiZIDE (GLUCOTROL XL) 2.5 MG 24 hr tablet TAKE 1 TABLET BY MOUTH ONCE DAILY WITH BREAKFAST FOR DIABETES 90 tablet 0   glucose blood (ONETOUCH ULTRA) test strip USE TO TEST BLOOD SUGAR TWO TO THREE TIMES DAILY 100 each 0   Lancet Devices (ONE TOUCH DELICA LANCING DEV) MISC Use as instructed to test blood sugar daily 100 each 5   meloxicam (MOBIC) 15 MG tablet Take 1 tablet (15 mg total) by mouth daily as needed for pain. Office visit required for further refills. 60 tablet 0   metFORMIN (GLUCOPHAGE-XR) 500 MG 24 hr tablet TAKE 2 TABLETS BY MOUTH ONCE DAILY WITH BREAKFAST FOR DIABETES 180 tablet 0   valsartan (DIOVAN) 160 MG tablet Take 1 tablet by mouth once daily for blood pressure 90 tablet 0   RESTASIS 0.05 % ophthalmic emulsion 1 drop 2 (two) times daily. (Patient not taking: Reported on 12/19/2021)     No current facility-administered medications on file prior to visit.    BP 106/62   Pulse  70   Temp 98.6 F (37 C) (Oral)   Ht '5\' 6"'$  (1.676 m)   Wt 232 lb (105.2 kg)   SpO2 97%   BMI 37.45 kg/m  Objective:   Physical Exam HENT:     Right Ear: Tympanic membrane and ear canal normal.     Left Ear: Tympanic membrane and ear canal normal.     Nose: Nose normal.     Right Sinus: No maxillary sinus tenderness or frontal sinus tenderness.     Left Sinus: No maxillary sinus tenderness or frontal sinus tenderness.  Eyes:     Conjunctiva/sclera: Conjunctivae normal.  Neck:     Thyroid: No thyromegaly.  Vascular: No carotid bruit.  Cardiovascular:     Rate and Rhythm: Normal rate and regular rhythm.     Heart sounds: Normal heart sounds.  Pulmonary:     Effort: Pulmonary effort is normal.     Breath sounds: Normal breath sounds. No wheezing or rales.  Abdominal:     General: Bowel sounds are normal.     Palpations: Abdomen is soft.     Tenderness: There is no abdominal tenderness.  Musculoskeletal:        General: Normal range of motion.     Cervical back: Neck supple.  Skin:    General: Skin is warm and dry.  Neurological:     Mental Status: He is alert and oriented to person, place, and time.     Cranial Nerves: No cranial nerve deficit.     Deep Tendon Reflexes: Reflexes are normal and symmetric.  Psychiatric:        Mood and Affect: Mood normal.           Assessment & Plan:   Problem List Items Addressed This Visit       Cardiovascular and Mediastinum   Essential hypertension    Controlled.   Continue amlodipine 5 mg daily, valsartan 160 mg daily.  CMP pending.      Relevant Medications   ezetimibe (ZETIA) 10 MG tablet   Other Relevant Orders   Comprehensive metabolic panel   CBC     Endocrine   Type 2 diabetes mellitus with hyperglycemia (HCC)    Repeat A1c pending.  Continue metformin 500 mg twice daily, glipizide XL 2.5 mg daily.  Intolerant to statins. Managed on ARB. Pneumonia vaccine up-to-date per patient. Eye exam  up-to-date.  Follow-up in 6 months.      Relevant Orders   Hemoglobin A1c   Comprehensive metabolic panel     Musculoskeletal and Integument   Osteoarthritis    Controlled.  Continue meloxicam 15 mg daily. Renal function pending.        Genitourinary   BPH (benign prostatic hyperplasia)    Controlled.  Continue tamsulosin 0.4 mg daily.      Relevant Medications   tamsulosin (FLOMAX) 0.4 MG CAPS capsule     Other   Hyperlipidemia - Primary    Repeat lipid panel pending today.  Continue Zetia 10 mg daily.      Relevant Medications   ezetimibe (ZETIA) 10 MG tablet   Other Relevant Orders   Lipid panel   Comprehensive metabolic panel   GAD (generalized anxiety disorder)    Controlled.  Continue venlafaxine ER 150 mg daily.  We will remove bupropion from medication list.      Relevant Medications   venlafaxine XR (EFFEXOR-XR) 150 MG 24 hr capsule   Moderate episode of recurrent major depressive disorder (HCC)    Controlled.  Continue venlafaxine ER 150 mg daily.  No use of bupropion since last year.  He does not wish to resume.  We will remove from medication list.      Relevant Medications   venlafaxine XR (EFFEXOR-XR) 150 MG 24 hr capsule   Other Visit Diagnoses     Screening for prostate cancer       Relevant Orders   PSA, Medicare          Pleas Koch, NP

## 2021-12-19 NOTE — Assessment & Plan Note (Signed)
Controlled.  Continue venlafaxine ER 150 mg daily.  No use of bupropion since last year.  He does not wish to resume.  We will remove from medication list.

## 2021-12-19 NOTE — Assessment & Plan Note (Signed)
Controlled.  Continue venlafaxine ER 150 mg daily.  We will remove bupropion from medication list.

## 2021-12-19 NOTE — Assessment & Plan Note (Signed)
Repeat A1c pending.  Continue metformin 500 mg twice daily, glipizide XL 2.5 mg daily.  Intolerant to statins. Managed on ARB. Pneumonia vaccine up-to-date per patient. Eye exam up-to-date.  Follow-up in 6 months.

## 2021-12-19 NOTE — Patient Instructions (Signed)
Stop by the lab prior to leaving today. I will notify you of your results once received.   Please schedule a follow up visit for 6 months for a diabetes check.  It was a pleasure to see you today!   

## 2021-12-19 NOTE — Assessment & Plan Note (Signed)
Controlled.  Continue tamsulosin 0.4 mg daily. 

## 2021-12-20 ENCOUNTER — Other Ambulatory Visit: Payer: Self-pay | Admitting: Primary Care

## 2021-12-20 DIAGNOSIS — R972 Elevated prostate specific antigen [PSA]: Secondary | ICD-10-CM

## 2021-12-26 ENCOUNTER — Encounter: Payer: Self-pay | Admitting: Primary Care

## 2022-01-15 ENCOUNTER — Other Ambulatory Visit: Payer: Self-pay | Admitting: Primary Care

## 2022-01-15 DIAGNOSIS — I10 Essential (primary) hypertension: Secondary | ICD-10-CM

## 2022-01-15 DIAGNOSIS — M1990 Unspecified osteoarthritis, unspecified site: Secondary | ICD-10-CM

## 2022-02-20 ENCOUNTER — Ambulatory Visit (INDEPENDENT_AMBULATORY_CARE_PROVIDER_SITE_OTHER): Payer: Medicare Other | Admitting: Dermatology

## 2022-02-20 DIAGNOSIS — L814 Other melanin hyperpigmentation: Secondary | ICD-10-CM | POA: Diagnosis not present

## 2022-02-20 DIAGNOSIS — L918 Other hypertrophic disorders of the skin: Secondary | ICD-10-CM | POA: Diagnosis not present

## 2022-02-20 DIAGNOSIS — L570 Actinic keratosis: Secondary | ICD-10-CM | POA: Diagnosis not present

## 2022-02-20 DIAGNOSIS — D692 Other nonthrombocytopenic purpura: Secondary | ICD-10-CM

## 2022-02-20 DIAGNOSIS — L821 Other seborrheic keratosis: Secondary | ICD-10-CM

## 2022-02-20 DIAGNOSIS — L578 Other skin changes due to chronic exposure to nonionizing radiation: Secondary | ICD-10-CM | POA: Diagnosis not present

## 2022-02-20 NOTE — Patient Instructions (Addendum)
Cryotherapy Aftercare  Wash gently with soap and water everyday.   Apply Vaseline and Band-Aid daily until healed.     Due to recent changes in healthcare laws, you may see results of your pathology and/or laboratory studies on MyChart before the doctors have had a chance to review them. We understand that in some cases there may be results that are confusing or concerning to you. Please understand that not all results are received at the same time and often the doctors may need to interpret multiple results in order to provide you with the best plan of care or course of treatment. Therefore, we ask that you please give us 2 business days to thoroughly review all your results before contacting the office for clarification. Should we see a critical lab result, you will be contacted sooner.   If You Need Anything After Your Visit  If you have any questions or concerns for your doctor, please call our main line at [phone] and press option 4 to reach your doctor's medical assistant. If no one answers, please leave a voicemail as directed and we will return your call as soon as possible. Messages left after 4 pm will be answered the following business day.   You may also send us a message via MyChart. We typically respond to MyChart messages within 1-2 business days.  For prescription refills, please ask your pharmacy to contact our office. Our fax number is [phone].  If you have an urgent issue when the clinic is closed that cannot wait until the next business day, you can page your doctor at the number below.    Please note that while we do our best to be available for urgent issues outside of office hours, we are not available 24/7.   If you have an urgent issue and are unable to reach us, you may choose to seek medical care at your doctor's office, retail clinic, urgent care center, or emergency room.  If you have a medical emergency, please immediately call 911 or go to the  emergency department.  Pager Numbers  - Dr. Kowalski: [phone]  - Dr. Moye: [phone]  - Dr. Stewart: [phone]  In the event of inclement weather, please call our main line at [phone] for an update on the status of any delays or closures.  Dermatology Medication Tips: Please keep the boxes that topical medications come in in order to help keep track of the instructions about where and how to use these. Pharmacies typically print the medication instructions only on the boxes and not directly on the medication tubes.   If your medication is too expensive, please contact our office at [phone] option 4 or send us a message through MyChart.   We are unable to tell what your co-pay for medications will be in advance as this is different depending on your insurance coverage. However, we may be able to find a substitute medication at lower cost or fill out paperwork to get insurance to cover a needed medication.   If a prior authorization is required to get your medication covered by your insurance company, please allow us 1-2 business days to complete this process.  Drug prices often vary depending on where the prescription is filled and some pharmacies may offer cheaper prices.  The website www.goodrx.com contains coupons for medications through different pharmacies. The prices here do not account for what the cost may be with help from insurance (it may be cheaper with your insurance), but the website can   give you the price if you did not use any insurance.  - You can print the associated coupon and take it with your prescription to the pharmacy.  - You may also stop by our office during regular business hours and pick up a GoodRx coupon card.  - If you need your prescription sent electronically to a different pharmacy, notify our office through [facility] MyChart or by phone at [phone] option 4.     Si Usted Necesita Algo Despus de Su Visita  Tambin puede  enviarnos un mensaje a travs de MyChart. Por lo general respondemos a los mensajes de MyChart en el transcurso de 1 a 2 das hbiles.  Para renovar recetas, por favor pida a su farmacia que se ponga en contacto con nuestra oficina. Nuestro nmero de fax es el [phone].  Si tiene un asunto urgente cuando la clnica est cerrada y que no puede esperar hasta el siguiente da hbil, puede llamar/localizar a su doctor(a) al nmero que aparece a continuacin.   Por favor, tenga en cuenta que aunque hacemos todo lo posible para estar disponibles para asuntos urgentes fuera del horario de oficina, no estamos disponibles las 24 horas del da, los 7 das de la semana.   Si tiene un problema urgente y no puede comunicarse con nosotros, puede optar por buscar atencin mdica  en el consultorio de su doctor(a), en una clnica privada, en un centro de atencin urgente o en una sala de emergencias.  Si tiene una emergencia mdica, por favor llame inmediatamente al 911 o vaya a la sala de emergencias.  Nmeros de bper  - Dr. Kowalski: [phone]  - Dra. Moye: [phone]  - Dra. Stewart: [phone]  En caso de inclemencias del tiempo, por favor llame a nuestra lnea principal al [phone] para una actualizacin sobre el estado de cualquier retraso o cierre.  Consejos para la medicacin en dermatologa: Por favor, guarde las cajas en las que vienen los medicamentos de uso tpico para ayudarle a seguir las instrucciones sobre dnde y cmo usarlos. Las farmacias generalmente imprimen las instrucciones del medicamento slo en las cajas y no directamente en los tubos del medicamento.   Si su medicamento es muy caro, por favor, pngase en contacto con nuestra oficina llamando al [phone] y presione la opcin 4 o envenos un mensaje a travs de MyChart.   No podemos decirle cul ser su copago por los medicamentos por adelantado ya que esto es diferente dependiendo de la cobertura de su seguro.  Sin embargo, es posible que podamos encontrar un medicamento sustituto a menor costo o llenar un formulario para que el seguro cubra el medicamento que se considera necesario.   Si se requiere una autorizacin previa para que su compaa de seguros cubra su medicamento, por favor permtanos de 1 a 2 das hbiles para completar este proceso.  Los precios de los medicamentos varan con frecuencia dependiendo del lugar de dnde se surte la receta y alguna farmacias pueden ofrecer precios ms baratos.  El sitio web www.goodrx.com tiene cupones para medicamentos de diferentes farmacias. Los precios aqu no tienen en cuenta lo que podra costar con la ayuda del seguro (puede ser ms barato con su seguro), pero el sitio web puede darle el precio si no utiliz ningn seguro.  - Puede imprimir el cupn correspondiente y llevarlo con su receta a la farmacia.  - Tambin puede pasar por nuestra oficina durante el horario de atencin regular y recoger una tarjeta de cupones de GoodRx.  -   Si necesita que su receta se enve electrnicamente a una farmacia diferente, informe a nuestra oficina a travs de MyChart de [place] o por telfono llamando al [phone] y presione la opcin 4.  

## 2022-02-20 NOTE — Progress Notes (Unsigned)
Follow-Up Visit   Subjective  Johnathan Dominguez is a 72 y.o. male who presents for the following: Follow-up (3 months f/u Aks on the face and scalp treated with 5FU/Calcipotriene cream about 3 months ago with a good response ). The patient presents for Upper Body Skin Exam (UBSE) for skin cancer screening and mole check.  The patient has spots, moles and lesions to be evaluated, some may be new or changing and the patient has concerns that these could be cancer.  The following portions of the chart were reviewed this encounter and updated as appropriate:   Tobacco  Allergies  Meds  Problems  Med Hx  Surg Hx  Fam Hx     Review of Systems:  No other skin or systemic complaints except as noted in HPI or Assessment and Plan.  Objective  Well appearing patient in no apparent distress; mood and affect are within normal limits.  All skin waist up examined.  Scalp, face x 10 (10) Erythematous thin papules/macules with gritty scale.    Assessment & Plan  AK (actinic keratosis) (10) Scalp, face x 10  Actinic keratoses are precancerous spots that appear secondary to cumulative UV radiation exposure/sun exposure over time. They are chronic with expected duration over 1 year. A portion of actinic keratoses will progress to squamous cell carcinoma of the skin. It is not possible to reliably predict which spots will progress to skin cancer and so treatment is recommended to prevent development of skin cancer.  Recommend daily broad spectrum sunscreen SPF 30+ to sun-exposed areas, reapply every 2 hours as needed.  Recommend staying in the shade or wearing long sleeves, sun glasses (UVA+UVB protection) and wide brim hats (4-inch brim around the entire circumference of the hat). Call for new or changing lesions.   Destruction of lesion - Scalp, face x 10 Complexity: simple   Destruction method: cryotherapy   Informed consent: discussed and consent obtained   Timeout:  patient name, date  of birth, surgical site, and procedure verified Lesion destroyed using liquid nitrogen: Yes   Region frozen until ice ball extended beyond lesion: Yes   Outcome: patient tolerated procedure well with no complications   Post-procedure details: wound care instructions given    Actinic Damage - chronic, secondary to cumulative UV radiation exposure/sun exposure over time - diffuse scaly erythematous macules with underlying dyspigmentation - Recommend daily broad spectrum sunscreen SPF 30+ to sun-exposed areas, reapply every 2 hours as needed.  - Recommend staying in the shade or wearing long sleeves, sun glasses (UVA+UVB protection) and wide brim hats (4-inch brim around the entire circumference of the hat). - Call for new or changing lesions.   Seborrheic Keratoses - Stuck-on, waxy, tan-brown papules and/or plaques  - Benign-appearing - Discussed benign etiology and prognosis. - Observe - Call for any changes   Purpura - Chronic; persistent and recurrent.  Treatable, but not curable. - Violaceous macules and patches - Benign - Related to trauma, age, sun damage and/or use of blood thinners, chronic use of topical and/or oral steroids - Observe - Can use OTC arnica containing moisturizer such as Dermend Bruise Formula if desired - Call for worsening or other concerns   Lentigines - Scattered tan macules - Due to sun exposure - Benign-appering, observe - Recommend daily broad spectrum sunscreen SPF 30+ to sun-exposed areas, reapply every 2 hours as needed. - Call for any changes   Acrochordons (Skin Tags) - Removal desired by patient - Fleshy, skin-colored pedunculated papules -  Benign appearing.  - Patient desires removal. Reviewed that this is not covered by insurance and they will be charged a cosmetic fee for removal. Patient signed non-covered consent.  - Prior to procedure, discussed risks of blister formation, small wound, skin dyspigmentation, or rare scar following  cryotherapy.  PROCEDURE - Cryotherapy was performed to affected areas. The procedure was tolerated well. Wound care was reviewed with the patient. They were advised to call with any concerns.  15 on the left inner thigh,17 on the right inner thigh Total number of treated acrochordons 32   Return in about 6 months (around 08/21/2022) for Aks, skin tags .  IMarye Round, CMA, am acting as scribe for Sarina Ser, MD .  Documentation: I have reviewed the above documentation for accuracy and completeness, and I agree with the above.  Sarina Ser, MD

## 2022-02-24 ENCOUNTER — Other Ambulatory Visit: Payer: Self-pay | Admitting: Primary Care

## 2022-02-24 DIAGNOSIS — E119 Type 2 diabetes mellitus without complications: Secondary | ICD-10-CM

## 2022-02-25 ENCOUNTER — Encounter: Payer: Self-pay | Admitting: Dermatology

## 2022-02-26 ENCOUNTER — Other Ambulatory Visit: Payer: Self-pay | Admitting: Primary Care

## 2022-02-26 DIAGNOSIS — I10 Essential (primary) hypertension: Secondary | ICD-10-CM

## 2022-02-27 ENCOUNTER — Ambulatory Visit
Admission: EM | Admit: 2022-02-27 | Discharge: 2022-02-27 | Disposition: A | Discharge status: Home / Self Care | Payer: Medicare Other

## 2022-02-27 ENCOUNTER — Emergency Department
Admission: EM | Admit: 2022-02-27 | Discharge: 2022-02-27 | Disposition: A | Discharge status: Home / Self Care | Payer: Medicare Other | Attending: Emergency Medicine | Admitting: Emergency Medicine

## 2022-02-27 ENCOUNTER — Other Ambulatory Visit: Payer: Self-pay

## 2022-02-27 ENCOUNTER — Encounter: Payer: Self-pay | Admitting: *Deleted

## 2022-02-27 DIAGNOSIS — W260XXA Contact with knife, initial encounter: Secondary | ICD-10-CM | POA: Diagnosis not present

## 2022-02-27 DIAGNOSIS — Z23 Encounter for immunization: Secondary | ICD-10-CM | POA: Insufficient documentation

## 2022-02-27 DIAGNOSIS — I10 Essential (primary) hypertension: Secondary | ICD-10-CM | POA: Insufficient documentation

## 2022-02-27 DIAGNOSIS — S59912A Unspecified injury of left forearm, initial encounter: Secondary | ICD-10-CM | POA: Diagnosis present

## 2022-02-27 DIAGNOSIS — S51812A Laceration without foreign body of left forearm, initial encounter: Secondary | ICD-10-CM | POA: Diagnosis not present

## 2022-02-27 DIAGNOSIS — E119 Type 2 diabetes mellitus without complications: Secondary | ICD-10-CM | POA: Diagnosis not present

## 2022-02-27 MED ORDER — TETANUS-DIPHTH-ACELL PERTUSSIS 5-2.5-18.5 LF-MCG/0.5 IM SUSY
0.5000 mL | PREFILLED_SYRINGE | Freq: Once | INTRAMUSCULAR | Status: AC
  Administered 2022-02-27: 0.5 mL via INTRAMUSCULAR
  Filled 2022-02-27: qty 0.5

## 2022-02-27 MED ORDER — LIDOCAINE-EPINEPHRINE (PF) 1 %-1:200000 IJ SOLN
10.0000 mL | Freq: Once | INTRAMUSCULAR | Status: AC
  Administered 2022-02-27: 10 mL
  Filled 2022-02-27: qty 10

## 2022-02-27 MED ORDER — SM DOUBLE ANTIBIOTIC 500-10000 UNIT/GM EX OINT
1.0000 | TOPICAL_OINTMENT | CUTANEOUS | Status: AC
  Administered 2022-02-27: 1 via TOPICAL
  Filled 2022-02-27: qty 1

## 2022-02-27 NOTE — ED Triage Notes (Signed)
Patient presents to UC for laceration to left forearm today at about 1400. He cut himself with a kitchen knife. States still has bleeding, secured it with bandage. Takes a 81 mg ASA. Last tdap approx 10 years ago.

## 2022-02-27 NOTE — ED Notes (Signed)
Patient is being discharged from the Urgent Care and sent to the Emergency Department via personal vehicle with wife . Per Hall Busing, NP, patient is in need of higher level of care due to laceration. Patient is aware and verbalizes understanding of plan of care.  Vitals:   02/27/22 1852  BP: (!) 157/95  Pulse: 66  Resp: 18  Temp: 98.2 F (36.8 C)  SpO2: 96%

## 2022-02-27 NOTE — Discharge Instructions (Addendum)
Do not get the sutured area wet for 24 hours. After 24 hours, shower/bathe as usual and pat the area dry. Change the bandage 2 times per day and apply antibiotic ointment. Leave open to air when at no risk of getting the area dirty, but cover at night before bed. See your PCP or go to Urgent Care in 7 days for suture removal or sooner for signs or concern of infection.  

## 2022-02-27 NOTE — ED Notes (Signed)
Pt verbalized understanding of discharge instructions and follow-up care instructions. Pt advised if symptoms worsen to return to ED.  

## 2022-02-27 NOTE — ED Notes (Signed)
Wound assessed, significantly bleeding upon removal of dressing. Pressure applied. Provider called to room. Patient reports unable to stop bleeding since 1400. Provider recommendations to transfer patient to the ED. Patient voiced understanding.

## 2022-02-27 NOTE — ED Triage Notes (Signed)
Pt has a laceration to left forearm.  Pt cut self with a knife while cutting paper off a chicken.  Bleeding controlled.  Pt alert  speech clear.

## 2022-02-27 NOTE — ED Provider Notes (Signed)
[facility] Provider Note    Event Date/Time   First MD Initiated Contact with Patient 02/27/22 2048     (approximate)   History   Laceration   HPI  Johnathan Dominguez is a 71 y.o. male with history of hypertension, hyperlipidemia, type 2 diabetes and as listed in EMR presents to the emergency department for treatment and evaluation after accidentally cutting his left forearm with a knife while opening a package.  He has a small laceration that has continued to bleed despite pressure.  Unsure of his last Tdap.Marland Kitchen     Physical Exam   Triage Vital Signs: ED Triage Vitals [02/27/22 1941]  Enc Vitals Group     BP (!) 156/92     Pulse Rate 63     Resp 20     Temp 98.5 F (36.9 C)     Temp Source Oral     SpO2 98 %     Weight 230 lb (104.3 kg)     Height '5\' 8"'$  (1.727 m)     Head Circumference      Peak Flow      Pain Score 0     Pain Loc      Pain Edu?      Excl. in [place]?     Most recent vital signs: Vitals:   02/27/22 1941  BP: (!) 156/92  Pulse: 63  Resp: 20  Temp: 98.5 F (36.9 C)  SpO2: 98%    General: Awake, no distress.  CV:  Good peripheral perfusion.  Resp:  Normal effort.  Abd:  No distention.  Other:  1 cm laceration to the volar aspect of the forearm with active bleeding when gauze was removed.   ED Results / Procedures / Treatments   Labs (all labs ordered are listed, but only abnormal results are displayed) Labs Reviewed - No data to display   EKG  Not indicated   RADIOLOGY  Image and radiology report reviewed by me.  Not indicated  PROCEDURES:  Critical Care performed: No  ..Laceration Repair  Date/Time: 02/27/2022 10:27 PM  Performed by: Victorino Dike, FNP Authorized by: Victorino Dike, FNP   Consent:    Consent obtained:  Verbal   Consent given by:  Patient   Risks discussed:  Pain and poor wound healing Universal protocol:    Patient identity confirmed:  Verbally with patient Anesthesia:     Anesthesia method:  Local infiltration   Local anesthetic:  Lidocaine 1% WITH epi Laceration details:    Location:  Shoulder/arm   Shoulder/arm location:  L lower arm   Length (cm):  1 Pre-procedure details:    Preparation:  Patient was prepped and draped in usual sterile fashion Treatment:    Area cleansed with:  Povidone-iodine and saline   Irrigation method:  Syringe Skin repair:    Repair method:  Sutures   Suture size:  5-0   Suture material:  Nylon   Suture technique:  Simple interrupted   Number of sutures:  3 Approximation:    Approximation:  Close Repair type:    Repair type:  Simple Post-procedure details:    Dressing:  Antibiotic ointment and sterile dressing   Procedure completion:  Tolerated well, no immediate complications    MEDICATIONS ORDERED IN ED: Medications  lidocaine-EPINEPHrine (PF) (XYLOCAINE-EPINEPHrine) 1 %-1:200000 (PF) injection 10 mL (has no administration in time range)  SM Double Antibiotic 500-10000 UNIT/GM OINT 1 Application (has no administration in time range)  Tdap (BOOSTRIX) injection 0.5 mL (has no administration in time range)     IMPRESSION / MDM / ASSESSMENT AND PLAN / ED COURSE   I have reviewed the triage note.  Differential diagnosis includes, but is not limited to, skin laceration, venous injury, arterial bleed, tendon injury  71 year old male presenting to the emergency department after accidentally cutting himself with a knife while trying to open a package of chicken earlier this evening.  See HPI for further details.  Wound cleaned and repaired as above.  Patient tolerated the procedure well wound care instructions were discussed.  He will see primary care or go to urgent care for suture removal in approximately 7 days.  He was encouraged to be reevaluated sooner for any sign or concern of infection.  Tdap was updated.      FINAL CLINICAL IMPRESSION(S) / ED DIAGNOSES   Final diagnoses:  Laceration of left forearm,  initial encounter     Rx / DC Orders   ED Discharge Orders     None        Note:  This document was prepared using Dragon voice recognition software and may include unintentional dictation errors.   Victorino Dike, FNP 02/27/22 2228    Harvest Dark, MD 02/27/22 2241

## 2022-02-27 NOTE — ED Notes (Signed)
Pt's laceration was dressed with non-adherent dressing and wrapped in coband.

## 2022-03-04 ENCOUNTER — Telehealth: Payer: Self-pay

## 2022-03-04 NOTE — Chronic Care Management (AMB) (Signed)
    Chronic Care Management Pharmacy Assistant   Name: Johnathan Dominguez  MRN: [id number] DOB: [birth year]  Reason for Encounter: Hospital Follow Up Non CCM    Medications: Outpatient Encounter Medications as of 03/04/2022  Medication Sig   amLODipine (NORVASC) 5 MG tablet Take 1 tablet (5 mg total) by mouth daily. for blood pressure.   BESIVANCE 0.6 % SUSP Place 1 drop into the right eye 3 (three) times daily.   ezetimibe (ZETIA) 10 MG tablet Take 1 tablet (10 mg total) by mouth daily. for cholesterol.   fluticasone (FLONASE) 50 MCG/ACT nasal spray Place 2 sprays into both nostrils daily.   glipiZIDE (GLUCOTROL XL) 2.5 MG 24 hr tablet TAKE 1 TABLET BY MOUTH ONCE DAILY WITH BREAKFAST FOR DIABETES   glucose blood (ONETOUCH ULTRA) test strip USE TO TEST BLOOD SUGAR TWO TO THREE TIMES DAILY   Lancet Devices (ONE TOUCH DELICA LANCING DEV) MISC Use as instructed to test blood sugar daily   meloxicam (MOBIC) 15 MG tablet Take 1 tablet (15 mg total) by mouth daily as needed for pain.   metFORMIN (GLUCOPHAGE-XR) 500 MG 24 hr tablet TAKE 2 TABLETS BY MOUTH ONCE DAILY WITH BREAKFAST FOR DIABETES   RESTASIS 0.05 % ophthalmic emulsion 1 drop 2 (two) times daily. (Patient not taking: Reported on 12/19/2021)   tamsulosin (FLOMAX) 0.4 MG CAPS capsule TAKE 1 CAPSULE BY MOUTH ONCE DAILY AFTER  SUPPER  FOR  URINATION   valsartan (DIOVAN) 160 MG tablet Take 1 tablet by mouth once daily for blood pressure   venlafaxine XR (EFFEXOR-XR) 150 MG 24 hr capsule TAKE 1 CAPSULE BY MOUTH ONCE DAILY WITH BREAKFAST FOR ANXIETY   No facility-administered encounter medications on file as of 03/04/2022.   Prior to ED- 02/27/22-Patient is being discharged from the Urgent Care and sent to the Emergency Department via personal vehicle with wife . Per Hall Busing, NP, patient is in need of higher level of care due to laceration.   Reviewed hospital notes for details of recent visit. Patient has been contacted by Transitions of Care  team: No  Admitted to the ED on 02/27/22. Discharge date was 02/27/22.  Discharged from [facility] ED  Discharge diagnosis (Principal Problem): laceration left forearm Patient was discharged to Home  Brief summary of hospital course: Differential diagnosis includes, but is not limited to, skin laceration, venous injury, arterial bleed, tendon injury   71 year old male presenting to the emergency department after accidentally cutting himself with a knife while trying to open a package of chicken earlier this evening.  See HPI for further details.   Wound cleaned and repaired as above.  Patient tolerated the procedure well wound care instructions were discussed.  He will see primary care or go to urgent care for suture removal in approximately 7 days.  He was encouraged to be reevaluated sooner for any sign or concern of infection.  Tdap was updated.  Medications that remain the same after Hospital Discharge:??  -All other medications will remain the same.    Next CCM appt: none  Other upcoming appts: 03/24/22  Labs  Charlene Brooke, PharmD notified and will determine if action is needed.  Avel Sensor, [facility]  [phone]

## 2022-03-06 ENCOUNTER — Telehealth: Payer: Self-pay

## 2022-03-06 NOTE — Telephone Encounter (Signed)
     Patient  visit on 9/14  at [facility] ED   Have you been able to follow up with your primary care physician? YES  The patient was or was not able to obtain any needed medicine or equipment. YES  Are there diet recommendations that you are having difficulty following? NA  Patient expresses understanding of discharge instructions and education provided has no other needs at this time. [place], [facility], Care Management  [phone] [address], [place], [place] [address] Phone: [phone] Email: [email or web address]

## 2022-03-24 ENCOUNTER — Other Ambulatory Visit (INDEPENDENT_AMBULATORY_CARE_PROVIDER_SITE_OTHER): Payer: Medicare Other

## 2022-03-24 DIAGNOSIS — R972 Elevated prostate specific antigen [PSA]: Secondary | ICD-10-CM

## 2022-03-24 LAB — PSA: PSA: 3.63 ng/mL (ref 0.10–4.00)

## 2022-04-28 ENCOUNTER — Other Ambulatory Visit: Payer: Self-pay | Admitting: Primary Care

## 2022-04-28 DIAGNOSIS — M1990 Unspecified osteoarthritis, unspecified site: Secondary | ICD-10-CM

## 2022-06-24 ENCOUNTER — Ambulatory Visit (INDEPENDENT_AMBULATORY_CARE_PROVIDER_SITE_OTHER): Payer: Medicare Other | Admitting: Primary Care

## 2022-06-24 ENCOUNTER — Encounter: Payer: Self-pay | Admitting: Primary Care

## 2022-06-24 VITALS — BP 150/88 | HR 82 | Temp 98.5°F | Ht 68.0 in | Wt 232.0 lb

## 2022-06-24 DIAGNOSIS — M25561 Pain in right knee: Secondary | ICD-10-CM | POA: Diagnosis not present

## 2022-06-24 DIAGNOSIS — M1A9XX0 Chronic gout, unspecified, without tophus (tophi): Secondary | ICD-10-CM | POA: Diagnosis not present

## 2022-06-24 DIAGNOSIS — M25562 Pain in left knee: Secondary | ICD-10-CM | POA: Diagnosis not present

## 2022-06-24 DIAGNOSIS — E1165 Type 2 diabetes mellitus with hyperglycemia: Secondary | ICD-10-CM | POA: Diagnosis not present

## 2022-06-24 DIAGNOSIS — G8929 Other chronic pain: Secondary | ICD-10-CM | POA: Diagnosis not present

## 2022-06-24 DIAGNOSIS — I10 Essential (primary) hypertension: Secondary | ICD-10-CM

## 2022-06-24 LAB — URIC ACID: Uric Acid, Serum: 5.9 mg/dL (ref 4.0–7.8)

## 2022-06-24 LAB — POCT GLYCOSYLATED HEMOGLOBIN (HGB A1C): Hemoglobin A1C: 7 % — AB (ref 4.0–5.6)

## 2022-06-24 LAB — MICROALBUMIN / CREATININE URINE RATIO
Creatinine,U: 75 mg/dL
Microalb Creat Ratio: 0.9 mg/g (ref 0.0–30.0)
Microalb, Ur: <0.7 mg/dL (ref 0.0–1.9)

## 2022-06-24 MED ORDER — VALSARTAN-HYDROCHLOROTHIAZIDE 160-12.5 MG PO TABS: 1.0000 | ORAL_TABLET | Freq: Every day | ORAL | 0 refills | Status: DC

## 2022-06-24 NOTE — Patient Instructions (Addendum)
Stop by the lab prior to leaving today. I will notify you of your results once received.   Stop taking valsartan for blood pressure.  Start taking valsartan-hydrochlorothiazide 160-12.5 mg once daily for blood pressure.   Please schedule a follow up visit to meet back with me in 2-3 weeks for blood pressure check.   It was a pleasure to see you today!

## 2022-06-24 NOTE — Assessment & Plan Note (Signed)
Above goal today, not checking home readings.  Continue amlodipine 5 mg daily. Add HCTZ 12.5 mg daily so will change his Rx to valsartan-HCTZ 160-12.5 mg daily.   We will plan to see him back in 2-3 weeks for BP check and BMP

## 2022-06-24 NOTE — Progress Notes (Signed)
Subjective:    Patient ID: Johnathan Dominguez, male    DOB: 1950-09-09, 72 y.o.   MRN: [id number]  HPI  Johnathan Dominguez is a very pleasant 72 y.o. male  has a past medical history of Acute constipation (11/18/2019), Allergic rhinitis, BPH (benign prostatic hyperplasia), Essential hypertension, GAD (generalized anxiety disorder), Hyperlipidemia, and Osteoarthritis. who presents today for follow up of diabetes and hypertension.   1) Type 2 Diabetes: Current medications include: metformin XR 1000 mg daily, Glipizide XL 2.5 mg daily   He is checking his blood glucose on occasion and is getting readings in the 140's.   Last A1C: 7.1 in July 2023, 7.0 today Last Eye Exam: UTD Last Foot Exam: Due  Pneumonia Vaccination: Completed previously  Urine Microalbumin: Due Statin: None. Intolerant   Dietary changes since last visit: None. Poor diet overall during the holidays   Exercise: Walking daily   BP Readings from Last 3 Encounters:  06/24/22 (!) 150/88  02/27/22 (!) 161/114  02/27/22 (!) 157/95    2) Hypertension: Currently managed on amlodipine 5 mg daily and valsartan 160 mg daily. He does not checking his BP at home. He denies headaches, dizziness, chest pain.   3) Chronic Knee Pain: Chronic to bilateral knees for years. He has a history of chronic gout, last flare was >5 years ago. He's not sure if these symptoms feel like a typical gout flare. No recent uric acid on file.    Review of Systems  Respiratory:  Negative for shortness of breath.   Cardiovascular:  Negative for chest pain.  Endocrine: Negative for polydipsia, polyphagia and polyuria.  Musculoskeletal:  Positive for arthralgias. Negative for joint swelling.  Skin:  Negative for color change.  Neurological:  Positive for numbness.         Past Medical History:  Diagnosis Date   Acute constipation 11/18/2019   Allergic rhinitis    BPH (benign prostatic hyperplasia)    Essential hypertension    GAD  (generalized anxiety disorder)    Hyperlipidemia    Osteoarthritis     Social History   Socioeconomic History   Marital status: Married    Spouse name: Not on file   Number of children: Not on file   Years of education: Not on file   Highest education level: Not on file  Occupational History   Not on file  Tobacco Use   Smoking status: Never   Smokeless tobacco: Never  Vaping Use   Vaping Use: Never used  Substance and Sexual Activity   Alcohol use: Not Currently   Drug use: Never   Sexual activity: Not on file  Other Topics Concern   Not on file  Social History Narrative   Not on file   Social Determinants of Health   Financial Resource Strain: Low Risk  (11/29/2021)   Overall Financial Resource Strain (CARDIA)    Difficulty of Paying Living Expenses: Not hard at all  Food Insecurity: No Food Insecurity (11/29/2021)   Hunger Vital Sign    Worried About Running Out of Food in the Last Year: Never true    Ran Out of Food in the Last Year: Never true  Transportation Needs: No Transportation Needs (11/29/2021)   PRAPARE - Hydrologist (Medical): No    Lack of Transportation (Non-Medical): No  Physical Activity: Inactive (11/29/2021)   Exercise Vital Sign    Days of Exercise per Week: 0 days    Minutes of Exercise per  Session: 0 min  Stress: No Stress Concern Present (11/29/2021)   [place]    Feeling of Stress : Not at all  Social Connections: Not on file  Intimate Partner Violence: Not At Risk (11/28/2020)   Humiliation, Afraid, Rape, and Kick questionnaire    Fear of Current or Ex-Partner: No    Emotionally Abused: No    Physically Abused: No    Sexually Abused: No    Past Surgical History:  Procedure Laterality Date   CERVICAL SPINE SURGERY     GALLBLADDER SURGERY  1979   HERNIA REPAIR     TONSILLECTOMY AND ADENOIDECTOMY      Family History  Problem Relation Age  of Onset   Heart disease Mother    Hypertension Mother    Heart disease Maternal Grandfather     Allergies  Allergen Reactions   Statins     Current Outpatient Medications on File Prior to Visit  Medication Sig Dispense Refill   amLODipine (NORVASC) 5 MG tablet Take 1 tablet (5 mg total) by mouth daily. for blood pressure. 90 tablet 2   BESIVANCE 0.6 % SUSP Place 1 drop into the right eye 3 (three) times daily.     ezetimibe (ZETIA) 10 MG tablet Take 1 tablet (10 mg total) by mouth daily. for cholesterol. 90 tablet 3   fluticasone (FLONASE) 50 MCG/ACT nasal spray Place 2 sprays into both nostrils daily.     glipiZIDE (GLUCOTROL XL) 2.5 MG 24 hr tablet TAKE 1 TABLET BY MOUTH ONCE DAILY WITH BREAKFAST FOR DIABETES 90 tablet 1   glucose blood (ONETOUCH ULTRA) test strip USE TO TEST BLOOD SUGAR TWO TO THREE TIMES DAILY 100 each 0   Lancet Devices (ONE TOUCH DELICA LANCING DEV) MISC Use as instructed to test blood sugar daily 100 each 5   meloxicam (MOBIC) 15 MG tablet TAKE 1 TABLET BY MOUTH ONCE DAILY AS NEEDED FOR PAIN 90 tablet 0   metFORMIN (GLUCOPHAGE-XR) 500 MG 24 hr tablet TAKE 2 TABLETS BY MOUTH ONCE DAILY WITH BREAKFAST FOR DIABETES 180 tablet 1   RESTASIS 0.05 % ophthalmic emulsion 1 drop 2 (two) times daily.     tamsulosin (FLOMAX) 0.4 MG CAPS capsule TAKE 1 CAPSULE BY MOUTH ONCE DAILY AFTER  SUPPER  FOR  URINATION 90 capsule 3   venlafaxine XR (EFFEXOR-XR) 150 MG 24 hr capsule TAKE 1 CAPSULE BY MOUTH ONCE DAILY WITH BREAKFAST FOR ANXIETY (Patient not taking: Reported on 06/24/2022) 90 capsule 3   No current facility-administered medications on file prior to visit.    BP (!) 150/88   Pulse 82   Temp 98.5 F (36.9 C) (Temporal)   Ht '5\' 8"'$  (1.727 m)   Wt 232 lb (105.2 kg)   SpO2 98%   BMI 35.28 kg/m  Objective:   Physical Exam Cardiovascular:     Rate and Rhythm: Normal rate and regular rhythm.  Pulmonary:     Effort: Pulmonary effort is normal.     Breath sounds: Normal  breath sounds. No wheezing or rales.  Musculoskeletal:     Cervical back: Neck supple.  Skin:    General: Skin is warm and dry.  Neurological:     Mental Status: He is alert and oriented to person, place, and time.           Assessment & Plan:  Type 2 diabetes mellitus with hyperglycemia, without long-term current use of insulin ([place]) -     Microalbumin /  creatinine urine ratio -     POCT glycosylated hemoglobin (Hb A1C)  Essential hypertension Assessment & Plan: Above goal today, not checking home readings.  Continue amlodipine 5 mg daily. Add HCTZ 12.5 mg daily so will change his Rx to valsartan-HCTZ 160-12.5 mg daily.   We will plan to see him back in 2-3 weeks for BP check and BMP  Orders: -     Valsartan-hydroCHLOROthiazide; Take 1 tablet by mouth daily. for blood pressure.  Dispense: 30 tablet; Refill: 0  Chronic pain of both knees Assessment & Plan: Likely secondary to osteoarthritis. Will check uric acid levels given history of gout.     Orders: -     Uric acid  Chronic gout without tophus, unspecified cause, unspecified site Assessment & Plan: No symptoms in years. Checking uric acid level today.  Adding HCTZ 12.5 mg to BP regimen. Consider discontinuation if flares return.   Orders: -     Uric acid        Pleas Koch, NP

## 2022-06-24 NOTE — Assessment & Plan Note (Signed)
No symptoms in years. Checking uric acid level today.  Adding HCTZ 12.5 mg to BP regimen. Consider discontinuation if flares return.

## 2022-06-24 NOTE — Assessment & Plan Note (Signed)
Likely secondary to osteoarthritis. Will check uric acid levels given history of gout.

## 2022-07-08 ENCOUNTER — Ambulatory Visit: Payer: Medicare Other | Admitting: Primary Care

## 2022-07-10 ENCOUNTER — Encounter: Payer: Self-pay | Admitting: Primary Care

## 2022-07-10 ENCOUNTER — Other Ambulatory Visit: Payer: Self-pay | Admitting: Primary Care

## 2022-07-10 ENCOUNTER — Ambulatory Visit (INDEPENDENT_AMBULATORY_CARE_PROVIDER_SITE_OTHER): Payer: Medicare Other | Admitting: Primary Care

## 2022-07-10 VITALS — BP 118/78 | HR 85 | Temp 97.2°F | Ht 68.0 in | Wt 231.0 lb

## 2022-07-10 DIAGNOSIS — F411 Generalized anxiety disorder: Secondary | ICD-10-CM | POA: Diagnosis not present

## 2022-07-10 DIAGNOSIS — I10 Essential (primary) hypertension: Secondary | ICD-10-CM

## 2022-07-10 LAB — BASIC METABOLIC PANEL
BUN: 16 mg/dL (ref 6–23)
CO2: 28 mEq/L (ref 19–32)
Calcium: 9.4 mg/dL (ref 8.4–10.5)
Chloride: 99 mEq/L (ref 96–112)
Creatinine, Ser: 1.06 mg/dL (ref 0.40–1.50)
GFR: 69.81 mL/min (ref >60.00)
Glucose, Bld: 271 mg/dL — ABNORMAL HIGH (ref 70–99)
Potassium: 3.7 mEq/L (ref 3.5–5.1)
Sodium: 135 mEq/L (ref 135–145)

## 2022-07-10 MED ORDER — VALSARTAN-HYDROCHLOROTHIAZIDE 160-12.5 MG PO TABS: 1.0000 | ORAL_TABLET | Freq: Every day | ORAL | 1 refills | Status: DC

## 2022-07-10 NOTE — Assessment & Plan Note (Signed)
Improved and at goal.  Continue valsartan-HCTZ 160-12.5 mg daily. BMP pending.

## 2022-07-10 NOTE — Assessment & Plan Note (Signed)
Overall controlled today. Continue venlafaxine ER 150 mg daily. Continue to monitor.

## 2022-07-10 NOTE — Progress Notes (Signed)
Subjective:    Patient ID: Johnathan Dominguez, male    DOB: 1951/02/19, 72 y.o.   MRN: [id number]  HPI  Johnathan Dominguez is a very pleasant 72 y.o. male with a history of hypertension, type 2 diabetes, hyperlipidemia, arthritis, anxiety/depression who presents today for follow up of hypertension and anxiety/depression.  1) Hypertension: Currently managed on amlodipine 5 mg daily and valsartan-HCTZ 160-12.5 mg daily. He was last evaluated on 06/24/22, BP was noted to be above goal on several prior visits so HCTZ 12.5 mg was added. Here is here for follow up today.  Since his last visit he is compliant to valsartan-HCTZ 160-12.5 mg daily. He is checking his BP at home which is running 140's/70's.   He denies dizziness, headaches.   BP Readings from Last 3 Encounters:  07/10/22 118/78  06/24/22 (!) 150/88  02/27/22 (!) 161/114    2) Anxiety/Depression: Currently managed on venlafaxine ER 150 mg daily for which he's been taking for years. Overall he feels well managed on venlafaxine. He does experience anxiety and irritability when around large crowds or family gatherings. He will typically remove himself from the situation and calm down.    Review of Systems  Respiratory:  Negative for shortness of breath.   Cardiovascular:  Negative for chest pain.  Neurological:  Negative for dizziness and headaches.  Psychiatric/Behavioral:  The patient is nervous/anxious.          Past Medical History:  Diagnosis Date   Acute constipation 11/18/2019   Allergic rhinitis    BPH (benign prostatic hyperplasia)    Essential hypertension    GAD (generalized anxiety disorder)    Hyperlipidemia    Osteoarthritis     Social History   Socioeconomic History   Marital status: Married    Spouse name: Not on file   Number of children: Not on file   Years of education: Not on file   Highest education level: Not on file  Occupational History   Not on file  Tobacco Use   Smoking status:  Never   Smokeless tobacco: Never  Vaping Use   Vaping Use: Never used  Substance and Sexual Activity   Alcohol use: Not Currently   Drug use: Never   Sexual activity: Not on file  Other Topics Concern   Not on file  Social History Narrative   Not on file   Social Determinants of Health   Financial Resource Strain: Low Risk  (11/29/2021)   Overall Financial Resource Strain (CARDIA)    Difficulty of Paying Living Expenses: Not hard at all  Food Insecurity: No Food Insecurity (11/29/2021)   Hunger Vital Sign    Worried About Running Out of Food in the Last Year: Never true    Ran Out of Food in the Last Year: Never true  Transportation Needs: No Transportation Needs (11/29/2021)   PRAPARE - Hydrologist (Medical): No    Lack of Transportation (Non-Medical): No  Physical Activity: Inactive (11/29/2021)   Exercise Vital Sign    Days of Exercise per Week: 0 days    Minutes of Exercise per Session: 0 min  Stress: No Stress Concern Present (11/29/2021)   [place]    Feeling of Stress : Not at all  Social Connections: Not on file  Intimate Partner Violence: Not At Risk (11/28/2020)   Humiliation, Afraid, Rape, and Kick questionnaire    Fear of Current or Ex-Partner: No  Emotionally Abused: No    Physically Abused: No    Sexually Abused: No    Past Surgical History:  Procedure Laterality Date   CERVICAL SPINE SURGERY     GALLBLADDER SURGERY  1979   HERNIA REPAIR     TONSILLECTOMY AND ADENOIDECTOMY      Family History  Problem Relation Age of Onset   Heart disease Mother    Hypertension Mother    Heart disease Maternal Grandfather     Allergies  Allergen Reactions   Statins     Current Outpatient Medications on File Prior to Visit  Medication Sig Dispense Refill   amLODipine (NORVASC) 5 MG tablet Take 1 tablet (5 mg total) by mouth daily. for blood pressure. 90 tablet 2    BESIVANCE 0.6 % SUSP Place 1 drop into the right eye 3 (three) times daily.     ezetimibe (ZETIA) 10 MG tablet Take 1 tablet (10 mg total) by mouth daily. for cholesterol. 90 tablet 3   fluticasone (FLONASE) 50 MCG/ACT nasal spray Place 2 sprays into both nostrils daily.     glipiZIDE (GLUCOTROL XL) 2.5 MG 24 hr tablet TAKE 1 TABLET BY MOUTH ONCE DAILY WITH BREAKFAST FOR DIABETES 90 tablet 1   glucose blood (ONETOUCH ULTRA) test strip USE TO TEST BLOOD SUGAR TWO TO THREE TIMES DAILY 100 each 0   Lancet Devices (ONE TOUCH DELICA LANCING DEV) MISC Use as instructed to test blood sugar daily 100 each 5   meloxicam (MOBIC) 15 MG tablet TAKE 1 TABLET BY MOUTH ONCE DAILY AS NEEDED FOR PAIN 90 tablet 0   metFORMIN (GLUCOPHAGE-XR) 500 MG 24 hr tablet TAKE 2 TABLETS BY MOUTH ONCE DAILY WITH BREAKFAST FOR DIABETES 180 tablet 1   RESTASIS 0.05 % ophthalmic emulsion 1 drop 2 (two) times daily.     tamsulosin (FLOMAX) 0.4 MG CAPS capsule TAKE 1 CAPSULE BY MOUTH ONCE DAILY AFTER  SUPPER  FOR  URINATION 90 capsule 3   valsartan-hydrochlorothiazide (DIOVAN-HCT) 160-12.5 MG tablet Take 1 tablet by mouth daily. for blood pressure. 30 tablet 0   venlafaxine XR (EFFEXOR-XR) 150 MG 24 hr capsule TAKE 1 CAPSULE BY MOUTH ONCE DAILY WITH BREAKFAST FOR ANXIETY 90 capsule 3   No current facility-administered medications on file prior to visit.    BP 118/78   Pulse 85   Temp (!) 97.2 F (36.2 C) (Temporal)   Ht '5\' 8"'$  (1.727 m)   Wt 231 lb (104.8 kg)   SpO2 98%   BMI 35.12 kg/m  Objective:   Physical Exam Cardiovascular:     Rate and Rhythm: Normal rate and regular rhythm.  Pulmonary:     Effort: Pulmonary effort is normal.     Breath sounds: Normal breath sounds. No wheezing or rales.  Musculoskeletal:     Cervical back: Neck supple.  Skin:    General: Skin is warm and dry.  Neurological:     Mental Status: He is alert and oriented to person, place, and time.  Psychiatric:        Mood and Affect: Mood  normal.           Assessment & Plan:  Essential hypertension Assessment & Plan: Improved and at goal.  Continue valsartan-HCTZ 160-12.5 mg daily. BMP pending.   Orders: -     Basic metabolic panel  GAD (generalized anxiety disorder) Assessment & Plan: Overall controlled today. Continue venlafaxine ER 150 mg daily. Continue to monitor.  Pleas Koch, NP

## 2022-07-10 NOTE — Patient Instructions (Signed)
Stop by the lab prior to leaving today. I will notify you of your results once received.   Please schedule a follow up visit for 6 months.  It was a pleasure to see you today!

## 2022-07-22 ENCOUNTER — Other Ambulatory Visit: Payer: Self-pay | Admitting: Primary Care

## 2022-07-22 DIAGNOSIS — M1990 Unspecified osteoarthritis, unspecified site: Secondary | ICD-10-CM

## 2022-07-27 IMAGING — DX DG CHEST 2V
2 series · 2 of 2 positions shown · non-contrast
Comparison: None.

CLINICAL DATA: Right-sided back pain, initial encounter

EXAM:
CHEST - 2 VIEW

[chest pa]
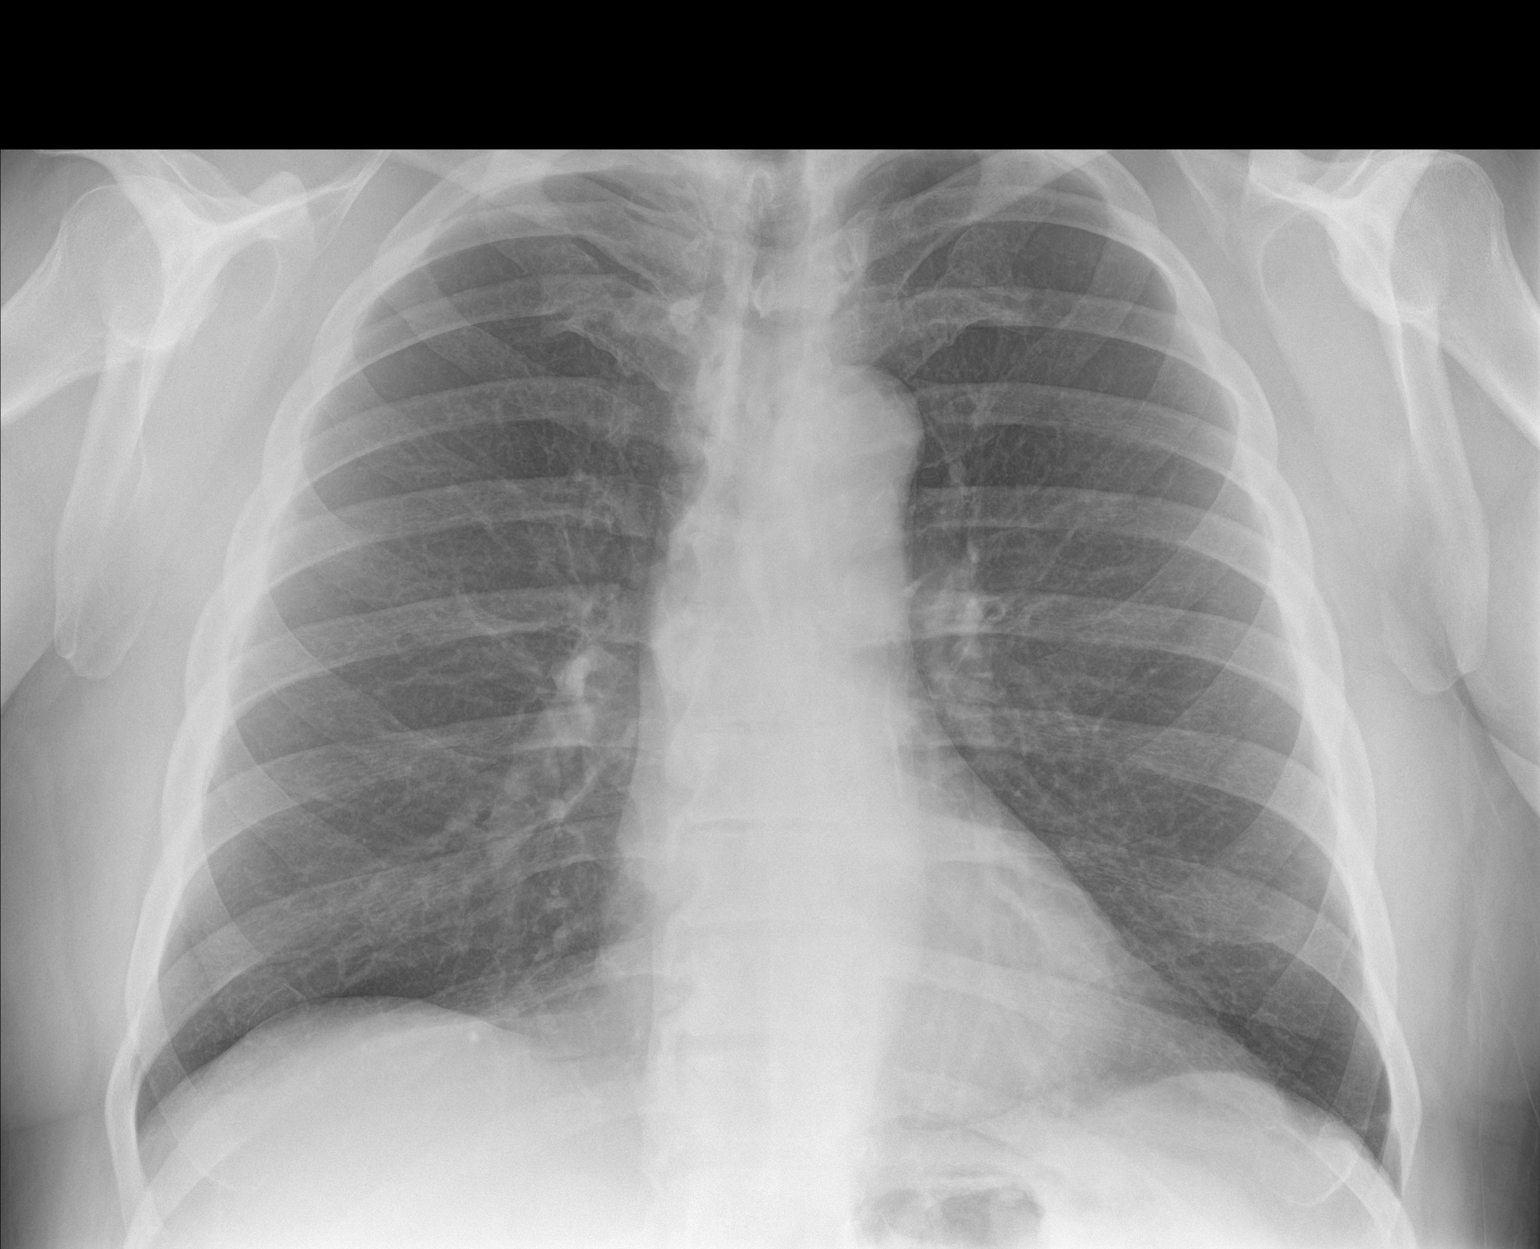

[chest lat]
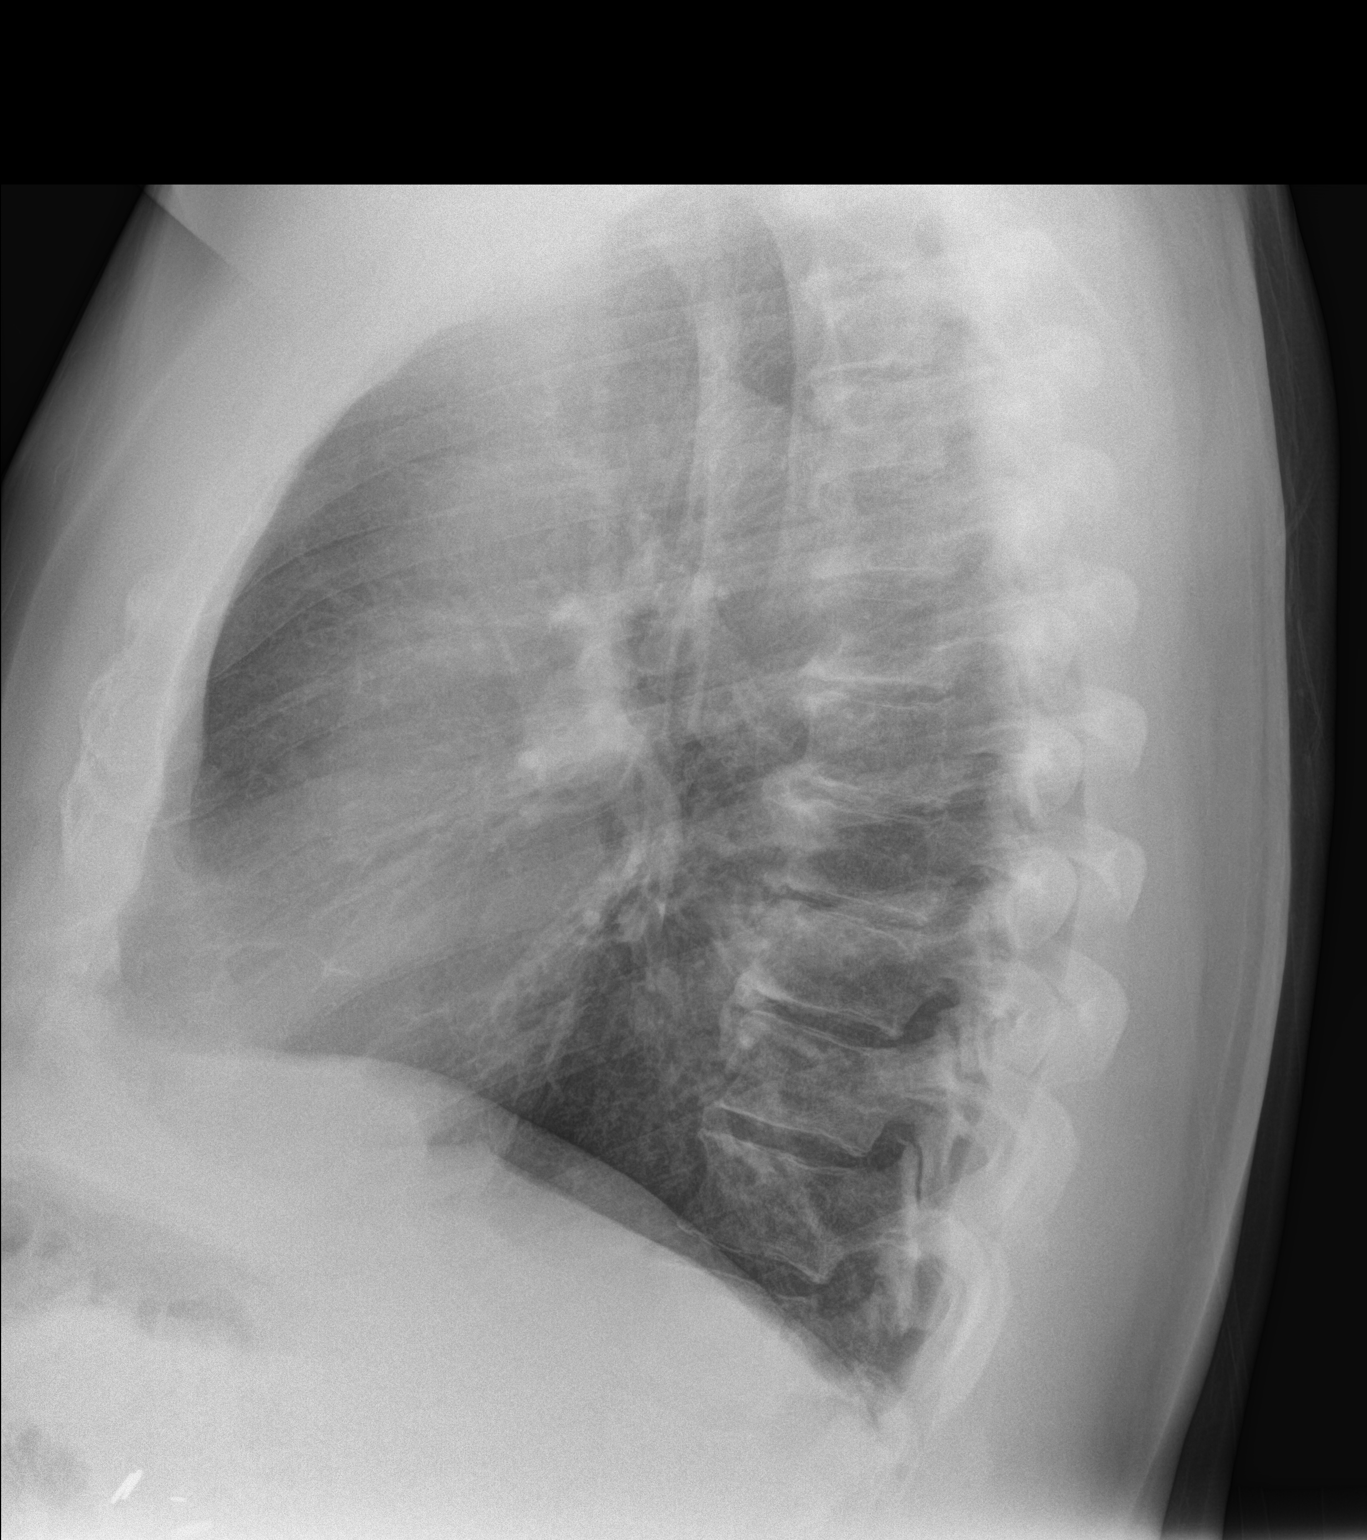

[2 of 2 positions shown; findings below may reference images not displayed]

FINDINGS: The heart size and mediastinal contours are within normal limits.
Both lungs are clear. The visualized skeletal structures show
postsurgical changes in the cervical spine. Mild degenerative
changes of the thoracic spine are seen.
IMPRESSION: No active cardiopulmonary disease.

## 2022-08-21 ENCOUNTER — Ambulatory Visit (INDEPENDENT_AMBULATORY_CARE_PROVIDER_SITE_OTHER): Payer: Medicare Other | Admitting: Dermatology

## 2022-08-21 DIAGNOSIS — Z79899 Other long term (current) drug therapy: Secondary | ICD-10-CM

## 2022-08-21 DIAGNOSIS — Z5111 Encounter for antineoplastic chemotherapy: Secondary | ICD-10-CM

## 2022-08-21 DIAGNOSIS — L570 Actinic keratosis: Secondary | ICD-10-CM | POA: Diagnosis not present

## 2022-08-21 DIAGNOSIS — D692 Other nonthrombocytopenic purpura: Secondary | ICD-10-CM | POA: Diagnosis not present

## 2022-08-21 DIAGNOSIS — Z7189 Other specified counseling: Secondary | ICD-10-CM

## 2022-08-21 DIAGNOSIS — L578 Other skin changes due to chronic exposure to nonionizing radiation: Secondary | ICD-10-CM

## 2022-08-21 MED ORDER — FLUOROURACIL 5 % EX CREA: TOPICAL_CREAM | CUTANEOUS | 2 refills | Status: DC

## 2022-08-21 NOTE — Progress Notes (Signed)
Follow-Up Visit   Subjective  Johnathan Dominguez is a 72 y.o. male who presents for the following: Follow-up (6 months f/u on precancers on his face and scalp, treated with LN2 6 months ago). The patient has spots, moles and lesions to be evaluated, some may be new or changing and the patient has concerns that these could be cancer.  The following portions of the chart were reviewed this encounter and updated as appropriate:   Tobacco  Allergies  Meds  Problems  Med Hx  Surg Hx  Fam Hx     Review of Systems:  No other skin or systemic complaints except as noted in HPI or Assessment and Plan.  Objective  Well appearing patient in no apparent distress; mood and affect are within normal limits.  A focused examination was performed including face,scalp, legs. Relevant physical exam findings are noted in the Assessment and Plan.  Scalp (16) Erythematous thin papules/macules with gritty scale.    Assessment & Plan  AK (actinic keratosis) (16) Scalp  Actinic keratoses are precancerous spots that appear secondary to cumulative UV radiation exposure/sun exposure over time. They are chronic with expected duration over 1 year. A portion of actinic keratoses will progress to squamous cell carcinoma of the skin. It is not possible to reliably predict which spots will progress to skin cancer and so treatment is recommended to prevent development of skin cancer.  Recommend daily broad spectrum sunscreen SPF 30+ to sun-exposed areas, reapply every 2 hours as needed.  Recommend staying in the shade or wearing long sleeves, sun glasses (UVA+UVB protection) and wide brim hats (4-inch brim around the entire circumference of the hat). Call for new or changing lesions.   Destruction of lesion - Scalp Complexity: simple   Destruction method: cryotherapy   Informed consent: discussed and consent obtained   Timeout:  patient name, date of birth, surgical site, and procedure verified Lesion  destroyed using liquid nitrogen: Yes   Region frozen until ice ball extended beyond lesion: Yes   Outcome: patient tolerated procedure well with no complications   Post-procedure details: wound care instructions given     Actinic Damage with PreCancerous Actinic Keratoses Counseling for Topical Chemotherapy Management: Patient exhibits: - Severe, confluent actinic changes with pre-cancerous actinic keratoses that is secondary to cumulative UV radiation exposure over time - Condition that is severe; chronic, not at goal. - diffuse scaly erythematous macules and papules with underlying dyspigmentation - Discussed Prescription "Field Treatment" topical Chemotherapy for Severe, Chronic Confluent Actinic Changes with Pre-Cancerous Actinic Keratoses  After 1 month- begin 5FU/Calcipotriene cream apply to scalp twice a day for 7 days  Field treatment involves treatment of an entire area of skin that has confluent Actinic Changes (Sun/ Ultraviolet light damage) and PreCancerous Actinic Keratoses by method of PhotoDynamic Therapy (PDT) and/or prescription Topical Chemotherapy agents such as 5-fluorouracil, 5-fluorouracil/calcipotriene, and/or imiquimod.  The purpose is to decrease the number of clinically evident and subclinical PreCancerous lesions to prevent progression to development of skin cancer by chemically destroying early precancer changes that may or may not be visible.  It has been shown to reduce the risk of developing skin cancer in the treated area. As a result of treatment, redness, scaling, crusting, and open sores may occur during treatment course. One or more than one of these methods may be used and may have to be used several times to control, suppress and eliminate the PreCancerous changes. Discussed treatment course, expected reaction, and possible side effects. - Recommend  daily broad spectrum sunscreen SPF 30+ to sun-exposed areas, reapply every 2 hours as needed.  - Staying in the  shade or wearing long sleeves, sun glasses (UVA+UVB protection) and wide brim hats (4-inch brim around the entire circumference of the hat) are also recommended. - Call for new or changing lesions.   Purpura - Chronic; persistent and recurrent.  Treatable, but not curable. - Violaceous macules and patches - Benign - Related to trauma, age, sun damage and/or use of blood thinners, chronic use of topical and/or oral steroids - Observe - Can use OTC arnica containing moisturizer such as Dermend Bruise Formula if desired - Call for worsening or other concerns   Return in about 6 months (around 02/21/2023) for Aks .  IMarye Round, CMA, am acting as scribe for Sarina Ser, MD .  Documentation: I have reviewed the above documentation for accuracy and completeness, and I agree with the above.  Sarina Ser, MD

## 2022-08-21 NOTE — Patient Instructions (Addendum)
Instructions for [facility] Medications  One or more of your medications was sent to the [facility] mail order compounding pharmacy. You will receive an email from them and can purchase the medicine through that link. It will then be mailed to your home at the address you confirmed. If for any reason you do not receive an email from them, please check your spam folder. If you still do not find the email, please let us know. [facility] phone number is [phone].        5-Fluorouracil/Calcipotriene Patient Education  In 1 month- apply twice a day for 7 days to entire scalp   Actinic keratoses are the dry, red scaly spots on the skin caused by sun damage. A portion of these spots can turn into skin cancer with time, and treating them can help prevent development of skin cancer.   Treatment of these spots requires removal of the defective skin cells. There are various ways to remove actinic keratoses, including freezing with liquid nitrogen, treatment with creams, or treatment with a blue light procedure in the office.   5-fluorouracil cream is a topical cream used to treat actinic keratoses. It works by interfering with the growth of abnormal fast-growing skin cells, such as actinic keratoses. These cells peel off and are replaced by healthy ones.   5-fluorouracil/calcipotriene is a combination of the 5-fluorouracil cream with a vitamin D analog cream called calcipotriene. The calcipotriene alone does not treat actinic keratoses. However, when it is combined with 5-fluorouracil, it helps the 5-fluorouracil treat the actinic keratoses much faster so that the same results can be achieved with a much shorter treatment time.  INSTRUCTIONS FOR 5-FLUOROURACIL/CALCIPOTRIENE CREAM:   5-fluorouracil/calcipotriene cream typically only needs to be used for 4-7 days. A thin layer should be applied twice a day to the treatment areas recommended by your physician.   If your physician  prescribed you separate tubes of 5-fluourouracil and calcipotriene, apply a thin layer of 5-fluorouracil followed by a thin layer of calcipotriene.   Avoid contact with your eyes, nostrils, and mouth. Do not use 5-fluorouracil/calcipotriene cream on infected or open wounds.   You will develop redness, irritation and some crusting at areas where you have pre-cancer damage/actinic keratoses. IF YOU DEVELOP PAIN, BLEEDING, OR SIGNIFICANT CRUSTING, STOP THE TREATMENT EARLY - you have already gotten a good response and the actinic keratoses should clear up well.  Wash your hands after applying 5-fluorouracil 5% cream on your skin.   A moisturizer or sunscreen with a minimum SPF 30 should be applied each morning.   Once you have finished the treatment, you can apply a thin layer of Vaseline twice a day to irritated areas to soothe and calm the areas more quickly. If you experience significant discomfort, contact your physician.  For some patients it is necessary to repeat the treatment for best results.  SIDE EFFECTS: When using 5-fluorouracil/calcipotriene cream, you may have mild irritation, such as redness, dryness, swelling, or a mild burning sensation. This usually resolves within 2 weeks. The more actinic keratoses you have, the more redness and inflammation you can expect during treatment. Eye irritation has been reported rarely. If this occurs, please let us know.  If you have any trouble using this cream, please call the office. If you have any other questions about this information, please do not hesitate to ask me before you leave the office.      Cryotherapy Aftercare  Wash gently with soap and water everyday.   Apply  Vaseline and Band-Aid daily until healed.       Due to recent changes in healthcare laws, you may see results of your pathology and/or laboratory studies on MyChart before the doctors have had a chance to review them. We understand that in some cases there may be  results that are confusing or concerning to you. Please understand that not all results are received at the same time and often the doctors may need to interpret multiple results in order to provide you with the best plan of care or course of treatment. Therefore, we ask that you please give [place] 2 business days to thoroughly review all your results before contacting the office for clarification. Should we see a critical lab result, you will be contacted sooner.   If You Need Anything After Your Visit  If you have any questions or concerns for your doctor, please call our main line at [phone] and press option 4 to reach your doctor's medical assistant. If no one answers, please leave a voicemail as directed and we will return your call as soon as possible. Messages left after 4 pm will be answered the following business day.   You may also send [place] a message via [place]. We typically respond to MyChart messages within 1-2 business days.  For prescription refills, please ask your pharmacy to contact our office. Our fax number is [phone].  If you have an urgent issue when the clinic is closed that cannot wait until the next business day, you can page your doctor at the number below.    Please note that while we do our best to be available for urgent issues outside of office hours, we are not available 24/7.   If you have an urgent issue and are unable to reach [place], you may choose to seek medical care at your doctor's office, retail clinic, urgent care center, or emergency room.  If you have a medical emergency, please immediately call 911 or go to the emergency department.  Pager Numbers  - Dr. Nehemiah Massed: [phone]  - Dr. Laurence Ferrari: [phone]  - Dr. Nicole Kindred: [phone]  In the event of inclement weather, please call our main line at [phone] for an update on the status of any delays or closures.  Dermatology Medication Tips: Please keep the boxes that topical medications come  in in order to help keep track of the instructions about where and how to use these. Pharmacies typically print the medication instructions only on the boxes and not directly on the medication tubes.   If your medication is too expensive, please contact our office at [phone] option 4 or send [place] a message through [place].   We are unable to tell what your co-pay for medications will be in advance as this is different depending on your insurance coverage. However, we may be able to find a substitute medication at lower cost or fill out paperwork to get insurance to cover a needed medication.   If a prior authorization is required to get your medication covered by your insurance company, please allow [place] 1-2 business days to complete this process.  Drug prices often vary depending on where the prescription is filled and some pharmacies may offer cheaper prices.  The website www.goodrx.com contains coupons for medications through different pharmacies. The prices here do not account for what the cost may be with help from insurance (it may be cheaper with your insurance), but the website can give you the price if you did not use any  insurance.  - You can print the associated coupon and take it with your prescription to the pharmacy.  - You may also stop by our office during regular business hours and pick up a GoodRx coupon card.  - If you need your prescription sent electronically to a different pharmacy, notify our office through [facility] or by phone at [phone] option 4.     Si Usted Necesita Algo Despus de Su Visita  Tambin puede enviarnos un mensaje a travs de Pharmacist, community. Por lo general respondemos a los mensajes de MyChart en el transcurso de 1 a 2 das hbiles.  Para renovar recetas, por favor pida a su farmacia que se ponga en contacto con nuestra oficina. Harland Dingwall de fax es [place] [phone].  Si tiene un asunto urgente cuando la clnica est cerrada y que no puede  esperar hasta el siguiente da hbil, puede llamar/localizar a su doctor(a) al nmero que aparece a continuacin.   Por favor, tenga en cuenta que aunque hacemos todo lo posible para estar disponibles para asuntos urgentes fuera del horario de [place], no estamos disponibles las 24 horas del da, los 7 das de la [place].   Si tiene un problema urgente y no puede comunicarse con nosotros, puede optar por buscar atencin mdica  en el consultorio de su doctor(a), en una clnica privada, en un centro de atencin urgente o en una sala de emergencias.  Si tiene Engineering geologist, por favor llame inmediatamente al 911 o vaya a la sala de emergencias.  Nmeros de bper  - Dr. Nehemiah Massed: [phone]  - Dra. Moye: [phone]  - Dra. Nicole Kindred: [phone]  En caso de inclemencias del [facility], por favor llame a Johnsie Kindred principal al [phone] para una actualizacin sobre el [place] de cualquier retraso o cierre.  Consejos para la medicacin en dermatologa: Por favor, guarde las cajas en las que vienen los medicamentos de uso tpico para ayudarle a seguir las instrucciones sobre dnde y cmo usarlos. Las farmacias generalmente imprimen las instrucciones del medicamento slo en las cajas y no directamente en los tubos del [place].   Si su medicamento es muy caro, por favor, pngase en contacto con Zigmund Daniel llamando al [phone] y presione la opcin 4 o envenos un mensaje a travs de Pharmacist, community.   No podemos decirle cul ser su copago por los medicamentos por adelantado ya que esto es diferente dependiendo de la cobertura de su seguro. Sin embargo, es posible que podamos encontrar un medicamento sustituto a Electrical engineer un formulario para que el seguro cubra el medicamento que se considera necesario.   Si se requiere una autorizacin previa para que su compaa de seguros [place] su medicamento, por favor permtanos de 1 a 2 das hbiles para completar este proceso.  Los  precios de los medicamentos varan con frecuencia dependiendo del Environmental consultant de dnde se surte la receta y alguna farmacias pueden ofrecer precios ms baratos.  El sitio web www.goodrx.com tiene cupones para medicamentos de Airline pilot. Los precios aqu no tienen en cuenta lo que podra costar con la ayuda del seguro (puede ser ms barato con su seguro), pero el sitio web puede darle el precio si no utiliz Research scientist (physical sciences).  - Puede imprimir el cupn correspondiente y llevarlo con su receta a la farmacia.  - Tambin puede pasar por nuestra oficina durante el horario de atencin regular y Charity fundraiser una tarjeta de cupones de GoodRx.  - Si necesita que su receta se enve electrnicamente a Ardelia Mems  farmacia diferente, informe a nuestra oficina a travs de MyChart de [place] o por telfono llamando al [phone] y presione la opcin 4.

## 2022-08-22 ENCOUNTER — Other Ambulatory Visit: Payer: Self-pay | Admitting: Primary Care

## 2022-08-22 DIAGNOSIS — E119 Type 2 diabetes mellitus without complications: Secondary | ICD-10-CM

## 2022-08-24 ENCOUNTER — Encounter: Payer: Self-pay | Admitting: Dermatology

## 2022-10-20 ENCOUNTER — Other Ambulatory Visit: Payer: Self-pay | Admitting: Primary Care

## 2022-10-20 DIAGNOSIS — M1990 Unspecified osteoarthritis, unspecified site: Secondary | ICD-10-CM

## 2022-10-23 ENCOUNTER — Other Ambulatory Visit: Payer: Self-pay | Admitting: Primary Care

## 2022-10-23 DIAGNOSIS — I10 Essential (primary) hypertension: Secondary | ICD-10-CM

## 2022-11-19 ENCOUNTER — Other Ambulatory Visit: Payer: Self-pay | Admitting: Primary Care

## 2022-11-19 DIAGNOSIS — E119 Type 2 diabetes mellitus without complications: Secondary | ICD-10-CM

## 2022-12-03 ENCOUNTER — Other Ambulatory Visit: Payer: Self-pay | Admitting: Primary Care

## 2022-12-03 DIAGNOSIS — E785 Hyperlipidemia, unspecified: Secondary | ICD-10-CM

## 2022-12-03 DIAGNOSIS — F411 Generalized anxiety disorder: Secondary | ICD-10-CM

## 2022-12-12 ENCOUNTER — Other Ambulatory Visit: Payer: Self-pay | Admitting: Primary Care

## 2022-12-12 DIAGNOSIS — N400 Enlarged prostate without lower urinary tract symptoms: Secondary | ICD-10-CM

## 2022-12-29 ENCOUNTER — Ambulatory Visit: Payer: Medicare Other

## 2022-12-29 VITALS — Ht 66.0 in | Wt 235.0 lb

## 2022-12-29 DIAGNOSIS — Z0000 Encounter for general adult medical examination without abnormal findings: Secondary | ICD-10-CM

## 2022-12-29 NOTE — Progress Notes (Signed)
Subjective:   Johnathan Dominguez is a 72 y.o. male who presents for Medicare Annual/Subsequent preventive examination.  Visit Complete: Virtual  I connected with  Johnathan Dominguez on 12/29/22 by a audio enabled telemedicine application and verified that I am speaking with the correct person using two identifiers.  Patient Location: Home  Provider Location: Home Office  I discussed the limitations of evaluation and management by telemedicine. The patient expressed understanding and agreed to proceed.   Review of Systems      Cardiac Risk Factors include: advanced age (>30men, >8 women);hypertension;dyslipidemia;male gender;sedentary lifestyle;diabetes mellitus     Objective:   Per patient no change in vitals since last visit, unable to obtain new vitals due to telehealth visit  Today's Vitals   12/29/22 1345  Weight: 235 lb (106.6 kg)  Height: 5\' 6"  (1.676 m)   Body mass index is 37.93 kg/m.     12/29/2022    1:57 PM 02/27/2022    7:42 PM 11/29/2021    3:16 PM 11/28/2020   11:18 AM  Advanced Directives  Does Patient Have a Medical Advance Directive? No No No No  Would patient like information on creating a medical advance directive? No - Patient declined   No - Patient declined    Current Medications (verified) Outpatient Encounter Medications as of 12/29/2022  Medication Sig   amLODipine (NORVASC) 5 MG tablet Take 1 tablet by mouth once daily for blood pressure   BESIVANCE 0.6 % SUSP Place 1 drop into the right eye 3 (three) times daily.   ezetimibe (ZETIA) 10 MG tablet TAKE 1 TABLET BY MOUTH ONCE DAILY FOR CHOLESTEROL   fluticasone (FLONASE) 50 MCG/ACT nasal spray Place 2 sprays into both nostrils daily.   glipiZIDE (GLUCOTROL XL) 2.5 MG 24 hr tablet TAKE 1 TABLET BY MOUTH ONCE DAILY WITH BREAKFAST FOR DIABETES   glucose blood (ONETOUCH ULTRA) test strip USE TO TEST BLOOD SUGAR TWO TO THREE TIMES DAILY   Lancet Devices (ONE TOUCH DELICA LANCING DEV) MISC Use as  instructed to test blood sugar daily   meloxicam (MOBIC) 15 MG tablet TAKE 1 TABLET BY MOUTH ONCE DAILY AS NEEDED FOR PAIN   metFORMIN (GLUCOPHAGE-XR) 500 MG 24 hr tablet TAKE 2 TABLETS BY MOUTH ONCE DAILY WITH BREAKFAST FOR DIABETES   RESTASIS 0.05 % ophthalmic emulsion 1 drop 2 (two) times daily.   tamsulosin (FLOMAX) 0.4 MG CAPS capsule TAKE 1 CAPSULE BY MOUTH ONCE DAILY AFTER  SUPPER  FOR  URINATION   valsartan-hydrochlorothiazide (DIOVAN-HCT) 160-12.5 MG tablet Take 1 tablet by mouth daily. for blood pressure.   venlafaxine XR (EFFEXOR-XR) 150 MG 24 hr capsule TAKE 1 CAPSULE BY MOUTH ONCE DAILY WITH BREAKFAST FOR ANXIETY   fluorouracil (EFUDEX) 5 % cream Apply to the entire scalp twice a day x 7 days (Patient not taking: Reported on 12/29/2022)   No facility-administered encounter medications on file as of 12/29/2022.    Allergies (verified) Statins   History: Past Medical History:  Diagnosis Date   Acute constipation 11/18/2019   Allergic rhinitis    BPH (benign prostatic hyperplasia)    Essential hypertension    GAD (generalized anxiety disorder)    Hyperlipidemia    Osteoarthritis    Past Surgical History:  Procedure Laterality Date   CERVICAL SPINE SURGERY     GALLBLADDER SURGERY  1979   HERNIA REPAIR     TONSILLECTOMY AND ADENOIDECTOMY     Family History  Problem Relation Age of Onset   Heart  disease Mother    Hypertension Mother    Heart disease Maternal Grandfather    Social History   Socioeconomic History   Marital status: Married    Spouse name: Not on file   Number of children: Not on file   Years of education: Not on file   Highest education level: Not on file  Occupational History   Not on file  Tobacco Use   Smoking status: Never   Smokeless tobacco: Never  Vaping Use   Vaping status: Never Used  Substance and Sexual Activity   Alcohol use: Not Currently   Drug use: Never   Sexual activity: Not on file  Other Topics Concern   Not on file   Social History Narrative   Not on file   Social Determinants of Health   Financial Resource Strain: Low Risk  (12/29/2022)   Overall Financial Resource Strain (CARDIA)    Difficulty of Paying Living Expenses: Not hard at all  Food Insecurity: No Food Insecurity (12/29/2022)   Hunger Vital Sign    Worried About Running Out of Food in the Last Year: Never true    Ran Out of Food in the Last Year: Never true  Transportation Needs: No Transportation Needs (12/29/2022)   PRAPARE - Administrator, Civil Service (Medical): No    Lack of Transportation (Non-Medical): No  Physical Activity: Inactive (12/29/2022)   Exercise Vital Sign    Days of Exercise per Week: 0 days    Minutes of Exercise per Session: 0 min  Stress: No Stress Concern Present (12/29/2022)   [employer] of Occupational Health - Occupational Stress Questionnaire    Feeling of Stress : Not at all  Social Connections: Moderately Isolated (12/29/2022)   Social Connection and Isolation Panel [NHANES]    Frequency of Communication with Friends and Family: Once a week    Frequency of Social Gatherings with Friends and Family: Twice a week    Attends Religious Services: Never    Database administrator or Organizations: No    Attends Engineer, structural: Never    Marital Status: Married    Tobacco Counseling Counseling given: Not Answered   Clinical Intake:  Pre-visit preparation completed: Yes  Pain : No/denies pain     BMI - recorded: 37.93 Nutritional Status: BMI > 30  Obese Nutritional Risks: None Diabetes: Yes (183 per pt) CBG done?: No Did pt. bring in CBG monitor from home?: No  How often do you need to have someone help you when you read instructions, pamphlets, or other written materials from your doctor or pharmacy?: 1 - Never  Interpreter Needed?: No  Information entered by :: C.Kensi Karr LPN   Activities of Daily Living    12/29/2022    1:58 PM  In your present state of  health, do you have any difficulty performing the following activities:  Hearing? 0  Vision? 0  Difficulty concentrating or making decisions? 0  Walking or climbing stairs? 0  Dressing or bathing? 0  Doing errands, shopping? 0  Preparing Food and eating ? N  Using the Toilet? N  In the past six months, have you accidently leaked urine? N  Do you have problems with loss of bowel control? N  Managing your Medications? N  Managing your Finances? N  Housekeeping or managing your Housekeeping? N    Patient Care Team: Doreene Nest, NP as PCP - General (Internal Medicine) Myrene Galas, OD as Referring Physician (Optometry)  Indicate  any recent Medical Services you may have received from other than [facility] providers in the past year (date may be approximate).     Assessment:   This is a routine wellness examination for [place].  Hearing/Vision screen Hearing Screening - Comments:: Denies hearing difficulties   Vision Screening - Comments:: No glasses - Has Cataracts - Dr.Woodard Goes every 6 months  Dietary issues and exercise activities discussed:     Goals Addressed             This Visit's Progress    Patient Stated       Increase activity, make healthier food choices.       Depression Screen    12/29/2022    1:55 PM 07/10/2022    9:01 AM 12/19/2021   11:32 AM 11/29/2021    3:17 PM 06/06/2021    8:14 AM 12/05/2020    8:12 AM 11/28/2020   11:19 AM  PHQ 2/9 Scores  PHQ - 2 Score 0 0 0 0 0 0 0  PHQ- 9 Score  5 0  0 1 0    Fall Risk    12/29/2022    1:49 PM 07/10/2022    9:01 AM 12/19/2021   11:33 AM 11/29/2021    3:16 PM 11/28/2020   11:18 AM  Fall Risk   Falls in the past year? 0 0 0 0 0  Number falls in past yr: 0 0 0 0 0  Injury with Fall? 0 0 0 0 0  Risk for fall due to : No Fall Risks No Fall Risks  Medication side effect Medication side effect  Follow up Falls prevention discussed;Falls evaluation completed Falls evaluation completed  Falls evaluation  completed;Education provided;Falls prevention discussed Falls evaluation completed;Falls prevention discussed    MEDICARE RISK AT HOME:  Medicare Risk at Home - 12/29/22 1358     Any stairs in or around the home? Yes    If so, are there any without handrails? No    Home free of loose throw rugs in walkways, pet beds, electrical cords, etc? Yes    Adequate lighting in your home to reduce risk of falls? Yes    Life alert? No    Use of a cane, walker or w/c? No    Grab bars in the bathroom? No    Shower chair or bench in shower? No    Elevated toilet seat or a handicapped toilet? Yes             TIMED UP AND GO:  Was the test performed?  No    Cognitive Function:    11/28/2020   11:20 AM  MMSE - Mini Mental State Exam  Not completed: Refused        12/29/2022    1:59 PM 11/29/2021    3:18 PM  6CIT Screen  What Year? 0 points 0 points  What month? 0 points 0 points  What time? 0 points 0 points  Count back from 20 0 points 0 points  Months in reverse 2 points 0 points  Repeat phrase 4 points 6 points  Total Score 6 points 6 points    Immunizations Immunization History  Administered Date(s) Administered   Fluad Quad(high Dose 65+) 04/12/2022   Influenza-Unspecified 05/05/2020, 04/16/2021   PFIZER(Purple Top)SARS-COV-2 Vaccination 08/19/2019, 09/09/2019   Respiratory Syncytial Virus Vaccine,Recomb Aduvanted(Arexvy) 04/12/2022   Tdap 02/27/2022   Zoster Recombinant(Shingrix) 12/15/2017, 05/03/2018    TDAP status: Up to date  Flu Vaccine status: Up to date  Pneumococcal vaccine status: Due, Education has been provided regarding the importance of this vaccine. Advised may receive this vaccine at local pharmacy or Health Dept. Aware to provide a copy of the vaccination record if obtained from local pharmacy or Health Dept. Verbalized acceptance and understanding.  Covid-19 vaccine status: Information provided on how to obtain vaccines.   Qualifies for Shingles  Vaccine? Yes   Zostavax completed  unknown   Shingrix Completed?: Yes  Screening Tests Health Maintenance  Topic Date Due   Pneumonia Vaccine 1+ Years old (1 of 1 - PCV) Never done   Medicare Annual Wellness (AWV)  11/30/2022   OPHTHALMOLOGY EXAM  12/19/2022   HEMOGLOBIN A1C  12/23/2022   Colonoscopy  06/25/2023 (Originally 05/22/1994)   INFLUENZA VACCINE  01/15/2023   Diabetic kidney evaluation - Urine ACR  06/25/2023   FOOT EXAM  06/25/2023   Diabetic kidney evaluation - eGFR measurement  07/11/2023   DTaP/Tdap/Td (2 - Td or Tdap) 02/28/2032   Hepatitis C Screening  Completed   Zoster Vaccines- Shingrix  Completed   HPV VACCINES  Aged Out   COVID-19 Vaccine  Discontinued    Health Maintenance  Health Maintenance Due  Topic Date Due   Pneumonia Vaccine 84+ Years old (1 of 1 - PCV) Never done   Medicare Annual Wellness (AWV)  11/30/2022   OPHTHALMOLOGY EXAM  12/19/2022   HEMOGLOBIN A1C  12/23/2022    Colorectal cancer screening: No longer required. Pt Declined.  Lung Cancer Screening: (Low Dose CT Chest recommended if Age 20-80 years, 20 pack-year currently smoking OR have quit w/in 15years.) does not qualify.   Lung Cancer Screening Referral: n/a  Additional Screening:  Hepatitis C Screening: does qualify; Completed 12/05/20  Vision Screening: Recommended annual ophthalmology exams for early detection of glaucoma and other disorders of the eye. Is the patient up to date with their annual eye exam?  Yes  Who is the provider or what is the name of the office in which the patient attends annual eye exams? Dr.Woodard If pt is not established with a provider, would they like to be referred to a provider to establish care? Yes .   Dental Screening: Recommended annual dental exams for proper oral hygiene  Diabetic Foot Exam: Diabetic Foot Exam: Completed 06/24/22  Community Resource Referral / Chronic Care Management: CRR required this visit?  No   CCM required this  visit?  No     Plan:     I have personally reviewed and noted the following in the patient's chart:   Medical and social history Use of alcohol, tobacco or illicit drugs  Current medications and supplements including opioid prescriptions. Patient is not currently taking opioid prescriptions. Functional ability and status Nutritional status Physical activity Advanced directives List of other physicians Hospitalizations, surgeries, and ER visits in previous 12 months Vitals Screenings to include cognitive, depression, and falls Referrals and appointments  In addition, I have reviewed and discussed with patient certain preventive protocols, quality metrics, and best practice recommendations. A written personalized care plan for preventive services as well as general preventive health recommendations were provided to patient.     Maryan Puls, LPN   1/61/0960   After Visit Summary: (MyChart) Due to this being a telephonic visit, the after visit summary with patients personalized plan was offered to patient via MyChart   Nurse Notes: Patient declined colonoscopy.

## 2022-12-29 NOTE — Patient Instructions (Signed)
Mr. Johnathan Dominguez , Thank you for taking time to come for your Medicare Wellness Visit. I appreciate your ongoing commitment to your health goals. Please review the following plan we discussed and let me know if I can assist you in the future.   These are the goals we discussed:  Goals      Patient Stated     11/28/2020, I will maintain and continue medications as prescribed.     Patient Stated     11/29/2021, stay alive     Patient Stated     Increase activity, make healthier food choices.        This is a list of the screening recommended for you and due dates:  Health Maintenance  Topic Date Due   Pneumonia Vaccine (1 of 1 - PCV) Never done   Medicare Annual Wellness Visit  11/30/2022   Eye exam for diabetics  12/19/2022   Hemoglobin A1C  12/23/2022   Colon Cancer Screening  06/25/2023*   Flu Shot  01/15/2023   Yearly kidney health urinalysis for diabetes  06/25/2023   Complete foot exam   06/25/2023   Yearly kidney function blood test for diabetes  07/11/2023   DTaP/Tdap/Td vaccine (2 - Td or Tdap) 02/28/2032   Hepatitis C Screening  Completed   Zoster (Shingles) Vaccine  Completed   HPV Vaccine  Aged Out   COVID-19 Vaccine  Discontinued  *Topic was postponed. The date shown is not the original due date.    Advanced directives: Advance directive discussed with you today. Even though you declined this today, please call our office should you change your mind, and we can give you the proper paperwork for you to fill out.   Conditions/risks identified: Aim for 30 minutes of exercise or brisk walking, 6-8 glasses of water, and 5 servings of fruits and vegetables each day.   Next appointment: Follow up in one year for your annual wellness visit. 12/31/23 @ 11:15 televisit  Preventive Care 65 Years and Older, Male  Preventive care refers to lifestyle choices and visits with your health care provider that can promote health and wellness. What does preventive care include? A yearly  physical exam. This is also called an annual well check. Dental exams once or twice a year. Routine eye exams. Ask your health care provider how often you should have your eyes checked. Personal lifestyle choices, including: Daily care of your teeth and gums. Regular physical activity. Eating a healthy diet. Avoiding tobacco and drug use. Limiting alcohol use. Practicing safe sex. Taking low doses of aspirin every day. Taking vitamin and mineral supplements as recommended by your health care provider. What happens during an annual well check? The services and screenings done by your health care provider during your annual well check will depend on your age, overall health, lifestyle risk factors, and family history of disease. Counseling  Your health care provider may ask you questions about your: Alcohol use. Tobacco use. Drug use. Emotional well-being. Home and relationship well-being. Sexual activity. Eating habits. History of falls. Memory and ability to understand (cognition). Work and work Astronomer. Screening  You may have the following tests or measurements: Height, weight, and BMI. Blood pressure. Lipid and cholesterol levels. These may be checked every 5 years, or more frequently if you are over 74 years old. Skin check. Lung cancer screening. You may have this screening every year starting at age 68 if you have a 30-pack-year history of smoking and currently smoke or have quit within  the past 15 years. Fecal occult blood test (FOBT) of the stool. You may have this test every year starting at age 29. Flexible sigmoidoscopy or colonoscopy. You may have a sigmoidoscopy every 5 years or a colonoscopy every 10 years starting at age 44. Prostate cancer screening. Recommendations will vary depending on your family history and other risks. Hepatitis C blood test. Hepatitis B blood test. Sexually transmitted disease (STD) testing. Diabetes screening. This is done by  checking your blood sugar (glucose) after you have not eaten for a while (fasting). You may have this done every 1-3 years. Abdominal aortic aneurysm (AAA) screening. You may need this if you are a current or former smoker. Osteoporosis. You may be screened starting at age 55 if you are at high risk. Talk with your health care provider about your test results, treatment options, and if necessary, the need for more tests. Vaccines  Your health care provider may recommend certain vaccines, such as: Influenza vaccine. This is recommended every year. Tetanus, diphtheria, and acellular pertussis (Tdap, Td) vaccine. You may need a Td booster every 10 years. Zoster vaccine. You may need this after age 38. Pneumococcal 13-valent conjugate (PCV13) vaccine. One dose is recommended after age 97. Pneumococcal polysaccharide (PPSV23) vaccine. One dose is recommended after age 37. Talk to your health care provider about which screenings and vaccines you need and how often you need them. This information is not intended to replace advice given to you by your health care provider. Make sure you discuss any questions you have with your health care provider. Document Released: 06/29/2015 Document Revised: 02/20/2016 Document Reviewed: 04/03/2015 Elsevier Interactive Patient Education  2017 [employer].  Fall Prevention in the Home Falls can cause injuries. They can happen to people of all ages. There are many things you can do to make your home safe and to help prevent falls. What can I do on the outside of my home? Regularly fix the edges of walkways and driveways and fix any cracks. Remove anything that might make you trip as you walk through a door, such as a raised step or threshold. Trim any bushes or trees on the path to your home. Use bright outdoor lighting. Clear any walking paths of anything that might make someone trip, such as rocks or tools. Regularly check to see if handrails are loose or  broken. Make sure that both sides of any steps have handrails. Any raised decks and porches should have guardrails on the edges. Have any leaves, snow, or ice cleared regularly. Use sand or salt on walking paths during winter. Clean up any spills in your garage right away. This includes oil or grease spills. What can I do in the bathroom? Use night lights. Install grab bars by the toilet and in the tub and shower. Do not use towel bars as grab bars. Use non-skid mats or decals in the tub or shower. If you need to sit down in the shower, use a plastic, non-slip stool. Keep the floor dry. Clean up any water that spills on the floor as soon as it happens. Remove soap buildup in the tub or shower regularly. Attach bath mats securely with double-sided non-slip rug tape. Do not have throw rugs and other things on the floor that can make you trip. What can I do in the bedroom? Use night lights. Make sure that you have a light by your bed that is easy to reach. Do not use any sheets or blankets that are too big  for your bed. They should not hang down onto the floor. Have a firm chair that has side arms. You can use this for support while you get dressed. Do not have throw rugs and other things on the floor that can make you trip. What can I do in the kitchen? Clean up any spills right away. Avoid walking on wet floors. Keep items that you use a lot in easy-to-reach places. If you need to reach something above you, use a strong step stool that has a grab bar. Keep electrical cords out of the way. Do not use floor polish or wax that makes floors slippery. If you must use wax, use non-skid floor wax. Do not have throw rugs and other things on the floor that can make you trip. What can I do with my stairs? Do not leave any items on the stairs. Make sure that there are handrails on both sides of the stairs and use them. Fix handrails that are broken or loose. Make sure that handrails are as long as  the stairways. Check any carpeting to make sure that it is firmly attached to the stairs. Fix any carpet that is loose or worn. Avoid having throw rugs at the top or bottom of the stairs. If you do have throw rugs, attach them to the floor with carpet tape. Make sure that you have a light switch at the top of the stairs and the bottom of the stairs. If you do not have them, ask someone to add them for you. What else can I do to help prevent falls? Wear shoes that: Do not have high heels. Have rubber bottoms. Are comfortable and fit you well. Are closed at the toe. Do not wear sandals. If you use a stepladder: Make sure that it is fully opened. Do not climb a closed stepladder. Make sure that both sides of the stepladder are locked into place. Ask someone to hold it for you, if possible. Clearly mark and make sure that you can see: Any grab bars or handrails. First and last steps. Where the edge of each step is. Use tools that help you move around (mobility aids) if they are needed. These include: Canes. Walkers. Scooters. Crutches. Turn on the lights when you go into a dark area. Replace any light bulbs as soon as they burn out. Set up your furniture so you have a clear path. Avoid moving your furniture around. If any of your floors are uneven, fix them. If there are any pets around you, be aware of where they are. Review your medicines with your doctor. Some medicines can make you feel dizzy. This can increase your chance of falling. Ask your doctor what other things that you can do to help prevent falls. This information is not intended to replace advice given to you by your health care provider. Make sure you discuss any questions you have with your health care provider. Document Released: 03/29/2009 Document Revised: 11/08/2015 Document Reviewed: 07/07/2014 Elsevier Interactive Patient Education  2017 [employer].

## 2023-01-03 ENCOUNTER — Other Ambulatory Visit: Payer: Self-pay | Admitting: Primary Care

## 2023-01-03 DIAGNOSIS — I10 Essential (primary) hypertension: Secondary | ICD-10-CM

## 2023-01-08 ENCOUNTER — Encounter: Payer: Self-pay | Admitting: Primary Care

## 2023-01-08 ENCOUNTER — Ambulatory Visit: Payer: Medicare Other | Admitting: Primary Care

## 2023-01-08 VITALS — BP 94/56 | HR 55 | Temp 97.2°F | Ht 66.0 in | Wt 221.0 lb

## 2023-01-08 DIAGNOSIS — E785 Hyperlipidemia, unspecified: Secondary | ICD-10-CM

## 2023-01-08 DIAGNOSIS — Z125 Encounter for screening for malignant neoplasm of prostate: Secondary | ICD-10-CM | POA: Diagnosis not present

## 2023-01-08 DIAGNOSIS — M25561 Pain in right knee: Secondary | ICD-10-CM

## 2023-01-08 DIAGNOSIS — R29898 Other symptoms and signs involving the musculoskeletal system: Secondary | ICD-10-CM | POA: Insufficient documentation

## 2023-01-08 DIAGNOSIS — E1165 Type 2 diabetes mellitus with hyperglycemia: Secondary | ICD-10-CM

## 2023-01-08 DIAGNOSIS — M25562 Pain in left knee: Secondary | ICD-10-CM

## 2023-01-08 DIAGNOSIS — F411 Generalized anxiety disorder: Secondary | ICD-10-CM

## 2023-01-08 DIAGNOSIS — I10 Essential (primary) hypertension: Secondary | ICD-10-CM | POA: Diagnosis not present

## 2023-01-08 DIAGNOSIS — F331 Major depressive disorder, recurrent, moderate: Secondary | ICD-10-CM

## 2023-01-08 DIAGNOSIS — M1990 Unspecified osteoarthritis, unspecified site: Secondary | ICD-10-CM

## 2023-01-08 DIAGNOSIS — N400 Enlarged prostate without lower urinary tract symptoms: Secondary | ICD-10-CM | POA: Diagnosis not present

## 2023-01-08 DIAGNOSIS — M1A9XX0 Chronic gout, unspecified, without tophus (tophi): Secondary | ICD-10-CM | POA: Diagnosis not present

## 2023-01-08 DIAGNOSIS — Z7984 Long term (current) use of oral hypoglycemic drugs: Secondary | ICD-10-CM

## 2023-01-08 DIAGNOSIS — G8929 Other chronic pain: Secondary | ICD-10-CM

## 2023-01-08 DIAGNOSIS — Z23 Encounter for immunization: Secondary | ICD-10-CM

## 2023-01-08 LAB — COMPREHENSIVE METABOLIC PANEL
ALT: 39 U/L (ref 0–53)
AST: 27 U/L (ref 0–37)
Albumin: 4.2 g/dL (ref 3.5–5.2)
Alkaline Phosphatase: 61 U/L (ref 39–117)
BUN: 18 mg/dL (ref 6–23)
CO2: 28 mEq/L (ref 19–32)
Calcium: 9.4 mg/dL (ref 8.4–10.5)
Chloride: 101 mEq/L (ref 96–112)
Creatinine, Ser: 1.11 mg/dL (ref 0.40–1.50)
GFR: 65.82 mL/min (ref >60.00)
Glucose, Bld: 161 mg/dL — ABNORMAL HIGH (ref 70–99)
Potassium: 4 mEq/L (ref 3.5–5.1)
Sodium: 138 mEq/L (ref 135–145)
Total Bilirubin: 0.6 mg/dL (ref 0.2–1.2)
Total Protein: 6.5 g/dL (ref 6.0–8.3)

## 2023-01-08 LAB — CBC
HCT: 42.7 % (ref 39.0–52.0)
Hemoglobin: 14.1 g/dL (ref 13.0–17.0)
MCHC: 33 g/dL (ref 30.0–36.0)
MCV: 88.4 fl (ref 78.0–100.0)
Platelets: 254 10*3/uL (ref 150.0–400.0)
RBC: 4.84 Mil/uL (ref 4.22–5.81)
RDW: 13.2 % (ref 11.5–15.5)
WBC: 7.9 10*3/uL (ref 4.0–10.5)

## 2023-01-08 LAB — LIPID PANEL
Cholesterol: 157 mg/dL (ref 0–200)
HDL: 32.3 mg/dL — ABNORMAL LOW (ref >39.00)
NonHDL: 124.79
Total CHOL/HDL Ratio: 5
Triglycerides: 211 mg/dL — ABNORMAL HIGH (ref 0.0–149.0)
VLDL: 42.2 mg/dL — ABNORMAL HIGH (ref 0.0–40.0)

## 2023-01-08 LAB — URIC ACID: Uric Acid, Serum: 7 mg/dL (ref 4.0–7.8)

## 2023-01-08 LAB — HM DIABETES EYE EXAM

## 2023-01-08 LAB — PSA, MEDICARE: PSA: 3.75 ng/ml (ref 0.10–4.00)

## 2023-01-08 LAB — LDL CHOLESTEROL, DIRECT: Direct LDL: 98 mg/dL

## 2023-01-08 LAB — HEMOGLOBIN A1C: Hgb A1c MFr Bld: 7.5 % — ABNORMAL HIGH (ref 4.6–6.5)

## 2023-01-08 NOTE — Assessment & Plan Note (Signed)
Controlled.  Continue venlafaxine ER 150 mg daily.

## 2023-01-08 NOTE — Assessment & Plan Note (Signed)
Controlled.  Continue tamsulosin 0.4 mg daily. PSA level pending.

## 2023-01-08 NOTE — Patient Instructions (Signed)
Stop by the lab prior to leaving today. I will notify you of your results once received.   You will either be contacted via phone regarding your referral to neurology, or you may receive a letter on your MyChart portal from our referral team with instructions for scheduling an appointment. Please let us know if you have not been contacted by anyone within two weeks.  Please schedule a follow up visit for 6 months for a diabetes check.  It was a pleasure to see you today!

## 2023-01-08 NOTE — Assessment & Plan Note (Signed)
Repeat lipid panel pending. Continue Zetia 10 mg daily.

## 2023-01-08 NOTE — Progress Notes (Signed)
Subjective:    Patient ID: Johnathan Dominguez, male    DOB: 02-12-1951, 72 y.o.   MRN: [id number]  HPI  Johnathan Dominguez is a very pleasant 72 y.o. male with a history of hypertension, type 2 diabetes, osteoarthritis, BPH, hyperlipidemia, GAD, MDD, chronic gout, chronic knee pain who presents today for follow-up of chronic conditions.  Immunizations: -Shingles: Completed Shingrix series -Pneumonia: Completed 10 years ago   Colonoscopy: Completed in 2015, due 2025 per patient  PSA: Due     1) Type 2 Diabetes:  Current medications include: Glipizide XL 2.5 mg daily, metformin XR 1000 mg daily  Last A1C: 7.0 in January 2024, pending today Last Eye Exam: Due Last Foot Exam: Up-to-date Pneumonia Vaccination: Due today Urine Microalbumin: Up-to-date Statin: Allergy, managed on Zetia  Dietary changes since last visit: None.   Exercise: Walking some  2) Hypertension: Currently managed on amlodipine 5 mg daily, valsartan-hydrochlorothiazide 160-12.5 mg daily.  He denies dizziness, headaches, chest pain, shortness of breath. He checks his BP irregularly at home, two days ago it was 145/95.   BP Readings from Last 3 Encounters:  01/08/23 (!) 94/56  07/10/22 118/78  06/24/22 (!) 150/88   3) MDD/GAD: Currently managed on venlafaxine XR 150 mg daily. He denies SI/HI. Overall feels well managed on this regimen.   4) Chronic Knee Pain/Chronic Gout: Currently managed on meloxicam 15 mg daily as needed. He is taking Meloxicam daily which helps significantly with his knee pain.   He has noticed chronic bilateral lower extremity weakness to the thighs for the last 3-6 months.  He mostly notices his symptoms when walking uphill.  He denies weakness when walking on flat ground, getting in and out of the car, during ADLs.  He denies back pain and hip pain. Sometimes he notices shaking to his bilateral lower extremities in the morning which resolves after getting out of bed. He denies  shuffling of his feet.  No family history of Parkinson's disease.  5) BPH: Currently managed on tamsulosin 0.4 mg daily which has helped with nocturia and weak urinary stream.    Review of Systems  Respiratory:  Negative for shortness of breath.   Cardiovascular:  Negative for chest pain.  Gastrointestinal:  Negative for constipation and diarrhea.  Neurological:  Positive for weakness. Negative for headaches.  Psychiatric/Behavioral:  The patient is not nervous/anxious.          Past Medical History:  Diagnosis Date   Acute constipation 11/18/2019   Allergic rhinitis    BPH (benign prostatic hyperplasia)    Essential hypertension    GAD (generalized anxiety disorder)    Hyperlipidemia    Osteoarthritis     Social History   Socioeconomic History   Marital status: Married    Spouse name: Not on file   Number of children: Not on file   Years of education: Not on file   Highest education level: Not on file  Occupational History   Not on file  Tobacco Use   Smoking status: Never   Smokeless tobacco: Never  Vaping Use   Vaping status: Never Used  Substance and Sexual Activity   Alcohol use: Not Currently   Drug use: Never   Sexual activity: Not on file  Other Topics Concern   Not on file  Social History Narrative   Not on file   Social Determinants of Health   Financial Resource Strain: Low Risk  (12/29/2022)   Overall Financial Resource Strain (CARDIA)  Difficulty of Paying Living Expenses: Not hard at all  Food Insecurity: No Food Insecurity (12/29/2022)   Hunger Vital Sign    Worried About Running Out of Food in the Last Year: Never true    Ran Out of Food in the Last Year: Never true  Transportation Needs: No Transportation Needs (12/29/2022)   PRAPARE - Administrator, Civil Service (Medical): No    Lack of Transportation (Non-Medical): No  Physical Activity: Inactive (12/29/2022)   Exercise Vital Sign    Days of Exercise per Week: 0 days     Minutes of Exercise per Session: 0 min  Stress: No Stress Concern Present (12/29/2022)   [employer] of Occupational Health - Occupational Stress Questionnaire    Feeling of Stress : Not at all  Social Connections: Moderately Isolated (12/29/2022)   Social Connection and Isolation Panel [NHANES]    Frequency of Communication with Friends and Family: Once a week    Frequency of Social Gatherings with Friends and Family: Twice a week    Attends Religious Services: Never    Database administrator or Organizations: No    Attends Banker Meetings: Never    Marital Status: Married  Catering manager Violence: Not At Risk (12/29/2022)   Humiliation, Afraid, Rape, and Kick questionnaire    Fear of Current or Ex-Partner: No    Emotionally Abused: No    Physically Abused: No    Sexually Abused: No    Past Surgical History:  Procedure Laterality Date   CERVICAL SPINE SURGERY     GALLBLADDER SURGERY  1979   HERNIA REPAIR     TONSILLECTOMY AND ADENOIDECTOMY      Family History  Problem Relation Age of Onset   Heart disease Mother    Hypertension Mother    Heart disease Maternal Grandfather     Allergies  Allergen Reactions   Statins     Current Outpatient Medications on File Prior to Visit  Medication Sig Dispense Refill   amLODipine (NORVASC) 5 MG tablet Take 1 tablet by mouth once daily for blood pressure 90 tablet 0   BESIVANCE 0.6 % SUSP Place 1 drop into the right eye 3 (three) times daily.     ezetimibe (ZETIA) 10 MG tablet TAKE 1 TABLET BY MOUTH ONCE DAILY FOR CHOLESTEROL 90 tablet 0   fluticasone (FLONASE) 50 MCG/ACT nasal spray Place 2 sprays into both nostrils daily.     glipiZIDE (GLUCOTROL XL) 2.5 MG 24 hr tablet TAKE 1 TABLET BY MOUTH ONCE DAILY WITH BREAKFAST FOR DIABETES 90 tablet 0   glucose blood (ONETOUCH ULTRA) test strip USE TO TEST BLOOD SUGAR TWO TO THREE TIMES DAILY 100 each 0   Lancet Devices (ONE TOUCH DELICA LANCING DEV) MISC Use as  instructed to test blood sugar daily 100 each 5   meloxicam (MOBIC) 15 MG tablet TAKE 1 TABLET BY MOUTH ONCE DAILY AS NEEDED FOR PAIN 90 tablet 0   metFORMIN (GLUCOPHAGE-XR) 500 MG 24 hr tablet TAKE 2 TABLETS BY MOUTH ONCE DAILY WITH BREAKFAST FOR DIABETES 180 tablet 0   RESTASIS 0.05 % ophthalmic emulsion 1 drop 2 (two) times daily.     tamsulosin (FLOMAX) 0.4 MG CAPS capsule TAKE 1 CAPSULE BY MOUTH ONCE DAILY AFTER  SUPPER  FOR  URINATION 90 capsule 0   valsartan-hydrochlorothiazide (DIOVAN-HCT) 160-12.5 MG tablet Take 1 tablet by mouth once daily for blood pressure 90 tablet 0   venlafaxine XR (EFFEXOR-XR) 150 MG 24 hr capsule  TAKE 1 CAPSULE BY MOUTH ONCE DAILY WITH BREAKFAST FOR ANXIETY 90 capsule 0   fluorouracil (EFUDEX) 5 % cream Apply to the entire scalp twice a day x 7 days (Patient not taking: Reported on 01/08/2023) 30 g 2   No current facility-administered medications on file prior to visit.    BP (!) 94/56   Pulse (!) 55   Temp (!) 97.2 F (36.2 C) (Temporal)   Ht 5\' 6"  (1.676 m)   Wt 221 lb (100.2 kg)   SpO2 98%   BMI 35.67 kg/m  Objective:   Physical Exam Cardiovascular:     Rate and Rhythm: Normal rate and regular rhythm.  Pulmonary:     Effort: Pulmonary effort is normal.     Breath sounds: Normal breath sounds. No wheezing or rales.  Musculoskeletal:     Cervical back: Neck supple.     Comments: 5 out of 5 strength to bilateral lower extremities  Skin:    General: Skin is warm and dry.  Neurological:     Mental Status: He is alert and oriented to person, place, and time.  Psychiatric:        Mood and Affect: Mood normal.           Assessment & Plan:  Essential hypertension Assessment & Plan: Controlled.  Continue amlodipine 5 mg daily, valsartan-hydrochlorothiazide 160-12.5 mg daily. CMP pending.  Orders: -     Comprehensive metabolic panel -     CBC  Type 2 diabetes mellitus with hyperglycemia, without long-term current use of insulin  (HCC) Assessment & Plan: Repeat A1c pending.  Continue metformin XR 1000 mg daily, glipizide XL 2.5 mg daily.  Prevnar 20 provided today. Discussed to schedule eye exam.  Follow-up in 6 months.  Orders: -     Hemoglobin A1c  Osteoarthritis, unspecified osteoarthritis type, unspecified site Assessment & Plan: Stable.  Continue meloxicam 15 mg daily. Renal function testing pending.   Benign prostatic hyperplasia without lower urinary tract symptoms Assessment & Plan: Controlled.  Continue tamsulosin 0.4 mg daily. PSA level pending.   Chronic gout without tophus, unspecified cause, unspecified site Assessment & Plan: No recent flares.  Repeat uric acid level pending.  Orders: -     Uric acid  Chronic pain of both knees Assessment & Plan: Controlled.  Continue meloxicam 15 mg daily.    GAD (generalized anxiety disorder) Assessment & Plan: Controlled.  Continue venlafaxine ER 150 mg daily.   Hyperlipidemia, unspecified hyperlipidemia type Assessment & Plan: Repeat lipid panel pending.  Continue Zetia 10 mg daily.  Orders: -     Lipid panel  Moderate episode of recurrent major depressive disorder (HCC) Assessment & Plan: Controlled.  Continue venlafaxine ER 150 mg daily.   Weakness of both lower extremities Assessment & Plan: Only to thighs.  Unclear etiology. Offered a referral for physical therapy for which she declines.  Referral placed to neurology for further evaluation and to rule out muscular disorders.  He agrees.  Orders: -     Ambulatory referral to Neurology  Screening for prostate cancer -     PSA, Medicare  Encounter for immunization -     Pneumococcal conjugate vaccine 20-valent        Doreene Nest, NP

## 2023-01-08 NOTE — Assessment & Plan Note (Signed)
Stable.  Continue meloxicam 15 mg daily. Renal function testing pending.

## 2023-01-08 NOTE — Assessment & Plan Note (Signed)
No recent flares. °Repeat uric acid level pending. °

## 2023-01-08 NOTE — Assessment & Plan Note (Signed)
Repeat A1c pending.  Continue metformin XR 1000 mg daily, glipizide XL 2.5 mg daily.  Prevnar 20 provided today. Discussed to schedule eye exam.  Follow-up in 6 months.

## 2023-01-08 NOTE — Assessment & Plan Note (Signed)
Controlled.  Continue amlodipine 5 mg daily, valsartan-hydrochlorothiazide 160-12.5 mg daily. CMP pending.

## 2023-01-08 NOTE — Assessment & Plan Note (Signed)
Only to thighs.  Unclear etiology. Offered a referral for physical therapy for which she declines.  Referral placed to neurology for further evaluation and to rule out muscular disorders.  He agrees.

## 2023-01-08 NOTE — Assessment & Plan Note (Signed)
Controlled.  Continue meloxicam 15 mg daily.

## 2023-01-12 ENCOUNTER — Encounter: Payer: Self-pay | Admitting: *Deleted

## 2023-01-14 ENCOUNTER — Other Ambulatory Visit: Payer: Self-pay | Admitting: Primary Care

## 2023-01-14 DIAGNOSIS — M1990 Unspecified osteoarthritis, unspecified site: Secondary | ICD-10-CM

## 2023-01-18 ENCOUNTER — Other Ambulatory Visit: Payer: Self-pay | Admitting: Primary Care

## 2023-01-18 DIAGNOSIS — I10 Essential (primary) hypertension: Secondary | ICD-10-CM

## 2023-02-23 ENCOUNTER — Other Ambulatory Visit: Payer: Self-pay | Admitting: Primary Care

## 2023-02-23 DIAGNOSIS — E119 Type 2 diabetes mellitus without complications: Secondary | ICD-10-CM

## 2023-02-28 ENCOUNTER — Other Ambulatory Visit: Payer: Self-pay | Admitting: Primary Care

## 2023-02-28 DIAGNOSIS — E785 Hyperlipidemia, unspecified: Secondary | ICD-10-CM

## 2023-02-28 DIAGNOSIS — F411 Generalized anxiety disorder: Secondary | ICD-10-CM

## 2023-03-02 ENCOUNTER — Other Ambulatory Visit: Payer: Self-pay

## 2023-03-11 ENCOUNTER — Ambulatory Visit: Payer: Medicare Other | Admitting: Dermatology

## 2023-03-11 ENCOUNTER — Encounter: Payer: Self-pay | Admitting: Dermatology

## 2023-03-11 ENCOUNTER — Other Ambulatory Visit: Payer: Self-pay | Admitting: Primary Care

## 2023-03-11 DIAGNOSIS — L578 Other skin changes due to chronic exposure to nonionizing radiation: Secondary | ICD-10-CM | POA: Diagnosis not present

## 2023-03-11 DIAGNOSIS — L570 Actinic keratosis: Secondary | ICD-10-CM | POA: Diagnosis not present

## 2023-03-11 DIAGNOSIS — L814 Other melanin hyperpigmentation: Secondary | ICD-10-CM

## 2023-03-11 DIAGNOSIS — W908XXA Exposure to other nonionizing radiation, initial encounter: Secondary | ICD-10-CM | POA: Diagnosis not present

## 2023-03-11 DIAGNOSIS — N400 Enlarged prostate without lower urinary tract symptoms: Secondary | ICD-10-CM

## 2023-03-11 DIAGNOSIS — L821 Other seborrheic keratosis: Secondary | ICD-10-CM

## 2023-03-11 DIAGNOSIS — L820 Inflamed seborrheic keratosis: Secondary | ICD-10-CM | POA: Diagnosis not present

## 2023-03-11 DIAGNOSIS — D692 Other nonthrombocytopenic purpura: Secondary | ICD-10-CM | POA: Diagnosis not present

## 2023-03-11 NOTE — Patient Instructions (Signed)

## 2023-03-11 NOTE — Progress Notes (Signed)
Follow-Up Visit   Subjective  Johnathan Dominguez is a 72 y.o. male who presents for the following: Precancerous skin lesions - recheck sun exposed areas for new or recurrent irregular skin lesions.  The patient has spots, moles and lesions to be evaluated, some may be new or changing and the patient may have concern these could be cancer.   The following portions of the chart were reviewed this encounter and updated as appropriate: medications, allergies, medical history  Review of Systems:  No other skin or systemic complaints except as noted in HPI or Assessment and Plan.  Objective  Well appearing patient in no apparent distress; mood and affect are within normal limits.   A focused examination was performed of the following areas: the face, scalp, arms, and hands   Relevant exam findings are noted in the Assessment and Plan.  Scalp x 18 (18) Erythematous thin papules/macules with gritty scale.   R arm x 1 Erythematous stuck-on, waxy papule or plaque    Assessment & Plan   ACTINIC DAMAGE - chronic, secondary to cumulative UV radiation exposure/sun exposure over time - diffuse scaly erythematous macules with underlying dyspigmentation - Recommend daily broad spectrum sunscreen SPF 30+ to sun-exposed areas, reapply every 2 hours as needed.  - Recommend staying in the shade or wearing long sleeves, sun glasses (UVA+UVB protection) and wide brim hats (4-inch brim around the entire circumference of the hat). - Call for new or changing lesions.  SEBORRHEIC KERATOSIS - Stuck-on, waxy, tan-brown papules and/or plaques  - Benign-appearing - Discussed benign etiology and prognosis. - Observe - Call for any changes  Purpura - Chronic; persistent and recurrent.  Treatable, but not curable. - Violaceous macules and patches - Benign - Related to trauma, age, sun damage and/or use of blood thinners, chronic use of topical and/or oral steroids - Observe - Can use OTC arnica  containing moisturizer such as Dermend Bruise Formula if desired - Call for worsening or other concerns  LENTIGINES Exam: scattered tan macules Due to sun exposure Treatment Plan: Benign-appearing, observe. Recommend daily broad spectrum sunscreen SPF 30+ to sun-exposed areas, reapply every 2 hours as needed.  Call for any changes  AK (actinic keratosis) (18) Scalp x 18  Actinic keratoses are precancerous spots that appear secondary to cumulative UV radiation exposure/sun exposure over time. They are chronic with expected duration over 1 year. A portion of actinic keratoses will progress to squamous cell carcinoma of the skin. It is not possible to reliably predict which spots will progress to skin cancer and so treatment is recommended to prevent development of skin cancer.  Recommend daily broad spectrum sunscreen SPF 30+ to sun-exposed areas, reapply every 2 hours as needed.  Recommend staying in the shade or wearing long sleeves, sun glasses (UVA+UVB protection) and wide brim hats (4-inch brim around the entire circumference of the hat). Call for new or changing lesions.   Destruction of lesion - Scalp x 18 (18) Complexity: simple   Destruction method: cryotherapy   Informed consent: discussed and consent obtained   Timeout:  patient name, date of birth, surgical site, and procedure verified Lesion destroyed using liquid nitrogen: Yes   Region frozen until ice ball extended beyond lesion: Yes   Outcome: patient tolerated procedure well with no complications   Post-procedure details: wound care instructions given    Inflamed seborrheic keratosis R arm x 1  Symptomatic, irritating, patient would like treated.   Destruction of lesion - R arm x 1 Complexity:  simple   Destruction method: cryotherapy   Informed consent: discussed and consent obtained   Timeout:  patient name, date of birth, surgical site, and procedure verified Lesion destroyed using liquid nitrogen: Yes    Region frozen until ice ball extended beyond lesion: Yes   Outcome: patient tolerated procedure well with no complications   Post-procedure details: wound care instructions given     Return in about 6 months (around 09/08/2023) for AK follow up .  Maylene Roes, CMA, am acting as scribe for Armida Sans, MD .  Documentation: I have reviewed the above documentation for accuracy and completeness, and I agree with the above.  Armida Sans, MD

## 2023-04-03 ENCOUNTER — Other Ambulatory Visit: Payer: Self-pay | Admitting: Primary Care

## 2023-04-03 DIAGNOSIS — I10 Essential (primary) hypertension: Secondary | ICD-10-CM

## 2023-04-13 ENCOUNTER — Other Ambulatory Visit: Payer: Self-pay | Admitting: Primary Care

## 2023-04-13 DIAGNOSIS — M1990 Unspecified osteoarthritis, unspecified site: Secondary | ICD-10-CM

## 2023-07-11 ENCOUNTER — Other Ambulatory Visit: Payer: Self-pay | Admitting: Primary Care

## 2023-07-11 DIAGNOSIS — M1990 Unspecified osteoarthritis, unspecified site: Secondary | ICD-10-CM

## 2023-07-14 ENCOUNTER — Ambulatory Visit (INDEPENDENT_AMBULATORY_CARE_PROVIDER_SITE_OTHER): Payer: Medicare Other | Admitting: Primary Care

## 2023-07-14 ENCOUNTER — Encounter: Payer: Self-pay | Admitting: Primary Care

## 2023-07-14 VITALS — BP 118/74 | HR 72 | Temp 97.2°F | Ht 66.0 in | Wt 217.0 lb

## 2023-07-14 DIAGNOSIS — Z7984 Long term (current) use of oral hypoglycemic drugs: Secondary | ICD-10-CM | POA: Diagnosis not present

## 2023-07-14 DIAGNOSIS — E1165 Type 2 diabetes mellitus with hyperglycemia: Secondary | ICD-10-CM

## 2023-07-14 LAB — POCT GLYCOSYLATED HEMOGLOBIN (HGB A1C): Hemoglobin A1C: 7 % — AB (ref 4.0–5.6)

## 2023-07-14 LAB — MICROALBUMIN / CREATININE URINE RATIO
Creatinine,U: 154.3 mg/dL
Microalb Creat Ratio: 0.5 mg/g (ref 0.0–30.0)
Microalb, Ur: <0.7 mg/dL (ref 0.0–1.9)

## 2023-07-14 NOTE — Progress Notes (Signed)
Subjective:    Patient ID: Johnathan Dominguez, male    DOB: 07/06/1950, 73 y.o.   MRN: [id number]  HPI  Johnathan Dominguez is a very pleasant 73 y.o. male with a history of type 2 diabetes, hypertension, hyperlipidemia who presents today for follow-up of diabetes.  Current medications include: Metformin ER 1000 mg daily, glipizide XL 2.5 mg daily.   He is checking his blood glucose on occasion and is getting readings of:  AM fasting 130 yesterday, mid 200s over the holidays  Last A1C: 7.19 December 2022, 7.0 today. Last Eye Exam: Up-to-date Last Foot Exam: Due Pneumonia Vaccination: 2024 Urine Microalbumin: Due Statin: None.  Intolerant.  Dietary changes since last visit: Poor diet over the holidays. Increased intake of sweets.    Exercise: None  BP Readings from Last 3 Encounters:  07/14/23 118/74  01/08/23 (!) 94/56  07/10/22 118/78       Review of Systems  Eyes:  Negative for visual disturbance.  Respiratory:  Negative for shortness of breath.   Cardiovascular:  Negative for chest pain.  Neurological:  Negative for numbness.         Past Medical History:  Diagnosis Date   Acute constipation 11/18/2019   Allergic rhinitis    BPH (benign prostatic hyperplasia)    Essential hypertension    GAD (generalized anxiety disorder)    Hyperlipidemia    Osteoarthritis     Social History   Socioeconomic History   Marital status: Married    Spouse name: Not on file   Number of children: Not on file   Years of education: Not on file   Highest education level: Not on file  Occupational History   Not on file  Tobacco Use   Smoking status: Never   Smokeless tobacco: Never  Vaping Use   Vaping status: Never Used  Substance and Sexual Activity   Alcohol use: Not Currently   Drug use: Never   Sexual activity: Not on file  Other Topics Concern   Not on file  Social History Narrative   Not on file   Social Drivers of Health   Financial Resource Strain: Low  Risk  (12/29/2022)   Overall Financial Resource Strain (CARDIA)    Difficulty of Paying Living Expenses: Not hard at all  Food Insecurity: No Food Insecurity (12/29/2022)   Hunger Vital Sign    Worried About Running Out of Food in the Last Year: Never true    Ran Out of Food in the Last Year: Never true  Transportation Needs: No Transportation Needs (12/29/2022)   PRAPARE - Administrator, Civil Service (Medical): No    Lack of Transportation (Non-Medical): No  Physical Activity: Inactive (12/29/2022)   Exercise Vital Sign    Days of Exercise per Week: 0 days    Minutes of Exercise per Session: 0 min  Stress: No Stress Concern Present (12/29/2022)   [employer] of Occupational Health - Occupational Stress Questionnaire    Feeling of Stress : Not at all  Social Connections: Moderately Isolated (12/29/2022)   Social Connection and Isolation Panel [NHANES]    Frequency of Communication with Friends and Family: Once a week    Frequency of Social Gatherings with Friends and Family: Twice a week    Attends Religious Services: Never    Database administrator or Organizations: No    Attends Banker Meetings: Never    Marital Status: Married  Catering manager Violence: Not At Risk (  12/29/2022)   Humiliation, Afraid, Rape, and Kick questionnaire    Fear of Current or Ex-Partner: No    Emotionally Abused: No    Physically Abused: No    Sexually Abused: No    Past Surgical History:  Procedure Laterality Date   CERVICAL SPINE SURGERY     GALLBLADDER SURGERY  1979   HERNIA REPAIR     TONSILLECTOMY AND ADENOIDECTOMY      Family History  Problem Relation Age of Onset   Heart disease Mother    Hypertension Mother    Heart disease Maternal Grandfather     Allergies  Allergen Reactions   Statins     Current Outpatient Medications on File Prior to Visit  Medication Sig Dispense Refill   amLODipine (NORVASC) 5 MG tablet Take 1 tablet by mouth once daily for  blood pressure 90 tablet 3   BESIVANCE 0.6 % SUSP Place 1 drop into the right eye 3 (three) times daily.     ezetimibe (ZETIA) 10 MG tablet TAKE 1 TABLET BY MOUTH ONCE DAILY FOR CHOLESTEROL 90 tablet 2   fluticasone (FLONASE) 50 MCG/ACT nasal spray Place 2 sprays into both nostrils daily.     glipiZIDE (GLUCOTROL XL) 2.5 MG 24 hr tablet TAKE 1 TABLET BY MOUTH ONCE DAILY WITH BREAKFAST FOR DIABETES 90 tablet 1   glucose blood (ONETOUCH ULTRA) test strip USE TO TEST BLOOD SUGAR TWO TO THREE TIMES DAILY 100 each 0   Lancet Devices (ONE TOUCH DELICA LANCING DEV) MISC Use as instructed to test blood sugar daily 100 each 5   meloxicam (MOBIC) 15 MG tablet TAKE 1 TABLET BY MOUTH ONCE DAILY AS NEEDED FOR PAIN 90 tablet 0   metFORMIN (GLUCOPHAGE-XR) 500 MG 24 hr tablet TAKE 2 TABLETS BY MOUTH ONCE DAILY WITH BREAKFAST FOR DIABETES 180 tablet 1   RESTASIS 0.05 % ophthalmic emulsion 1 drop 2 (two) times daily.     tamsulosin (FLOMAX) 0.4 MG CAPS capsule TAKE 1 CAPSULE BY MOUTH ONCE DAILY AFTER  SUPPER  FOR  URINATION 90 capsule 2   valsartan-hydrochlorothiazide (DIOVAN-HCT) 160-12.5 MG tablet Take 1 tablet by mouth once daily for blood pressure 90 tablet 2   venlafaxine XR (EFFEXOR-XR) 150 MG 24 hr capsule TAKE 1 CAPSULE BY MOUTH ONCE DAILY WITH BREAKFAST FOR ANXIETY 90 capsule 2   fluorouracil (EFUDEX) 5 % cream Apply to the entire scalp twice a day x 7 days (Patient not taking: Reported on 07/14/2023) 30 g 2   No current facility-administered medications on file prior to visit.    BP 118/74   Pulse 72   Temp (!) 97.2 F (36.2 C) (Temporal)   Ht 5\' 6"  (1.676 m)   Wt 217 lb (98.4 kg)   SpO2 98%   BMI 35.02 kg/m  Objective:   Physical Exam Cardiovascular:     Rate and Rhythm: Normal rate and regular rhythm.  Pulmonary:     Effort: Pulmonary effort is normal.     Breath sounds: Normal breath sounds.  Musculoskeletal:     Cervical back: Neck supple.  Skin:    General: Skin is warm and dry.   Neurological:     Mental Status: He is alert and oriented to person, place, and time.  Psychiatric:        Mood and Affect: Mood normal.           Assessment & Plan:  Type 2 diabetes mellitus with hyperglycemia, without long-term current use of insulin (HCC) Assessment & Plan:  Improved with A1c of 7.0 today.  Continue metformin ER 1000 mg daily, glipizide XL 2.5 mg daily. Foot exam today. Urine microalbumin due and pending.  Follow-up in 6 months.  Orders: -     POCT glycosylated hemoglobin (Hb A1C) -     Microalbumin / creatinine urine ratio        Doreene Nest, NP

## 2023-07-14 NOTE — Assessment & Plan Note (Signed)
Improved with A1c of 7.0 today.  Continue metformin ER 1000 mg daily, glipizide XL 2.5 mg daily. Foot exam today. Urine microalbumin due and pending.  Follow-up in 6 months.

## 2023-07-14 NOTE — Patient Instructions (Signed)
Stop by the lab prior to leaving today. I will notify you of your results once received.   Please schedule a follow up visit to meet with me in 6 months.   It was a pleasure to see you today!

## 2023-07-20 LAB — HM DIABETES EYE EXAM

## 2023-08-14 ENCOUNTER — Other Ambulatory Visit: Payer: Self-pay | Admitting: Primary Care

## 2023-08-14 DIAGNOSIS — E119 Type 2 diabetes mellitus without complications: Secondary | ICD-10-CM

## 2023-08-20 ENCOUNTER — Other Ambulatory Visit: Payer: Self-pay | Admitting: Primary Care

## 2023-08-20 DIAGNOSIS — E119 Type 2 diabetes mellitus without complications: Secondary | ICD-10-CM

## 2023-09-03 ENCOUNTER — Ambulatory Visit: Payer: Medicare Other | Admitting: Dermatology

## 2023-09-15 ENCOUNTER — Ambulatory Visit (INDEPENDENT_AMBULATORY_CARE_PROVIDER_SITE_OTHER): Admitting: Dermatology

## 2023-09-15 ENCOUNTER — Encounter: Payer: Self-pay | Admitting: Dermatology

## 2023-09-15 DIAGNOSIS — Z5111 Encounter for antineoplastic chemotherapy: Secondary | ICD-10-CM | POA: Diagnosis not present

## 2023-09-15 DIAGNOSIS — W908XXA Exposure to other nonionizing radiation, initial encounter: Secondary | ICD-10-CM

## 2023-09-15 DIAGNOSIS — Z7189 Other specified counseling: Secondary | ICD-10-CM

## 2023-09-15 DIAGNOSIS — L820 Inflamed seborrheic keratosis: Secondary | ICD-10-CM

## 2023-09-15 DIAGNOSIS — L570 Actinic keratosis: Secondary | ICD-10-CM | POA: Diagnosis not present

## 2023-09-15 DIAGNOSIS — L821 Other seborrheic keratosis: Secondary | ICD-10-CM

## 2023-09-15 DIAGNOSIS — Z79899 Other long term (current) drug therapy: Secondary | ICD-10-CM

## 2023-09-15 DIAGNOSIS — L578 Other skin changes due to chronic exposure to nonionizing radiation: Secondary | ICD-10-CM | POA: Diagnosis not present

## 2023-09-15 DIAGNOSIS — D692 Other nonthrombocytopenic purpura: Secondary | ICD-10-CM

## 2023-09-15 NOTE — Patient Instructions (Addendum)
 In 1 month restart  this to areas on scalp  - ReStart 5-fluorouracil/calcipotriene cream twice a day for 10 days to affected areas including scalp. Prescription sent to [facility]. Patient advised they will receive an email to purchase the medication online and have it sent to their home. Patient provided with handout reviewing treatment course and side effects and advised to call or message [place] on MyChart with any concerns.  Reviewed course of treatment and expected reaction.  Patient advised to expect inflammation and crusting and advised that erosions are possible.  Patient advised to be diligent with sun protection during and after treatment. Counseled to keep medication out of reach of children and pets.    Instructions for [facility]  One or more of your medications was sent to the [facility] mail order [facility] pharmacy. You will receive an email from them and can purchase the medicine through that link. It will then be mailed to your home at the address you confirmed. If for any reason you do not receive an email from them, please check your spam folder. If you still do not find the email, please let us know. [facility] phone number is [phone].     5-Fluorouracil/Calcipotriene Patient Education   Actinic keratoses are the dry, red scaly spots on the skin caused by sun damage. A portion of these spots can turn into skin cancer with time, and treating them can help prevent development of skin cancer.   Treatment of these spots requires removal of the defective skin cells. There are various ways to remove actinic keratoses, including freezing with liquid nitrogen, treatment with creams, or treatment with a blue light procedure in the office.   5-fluorouracil cream is a topical cream used to treat actinic keratoses. It works by interfering with the growth of abnormal fast-growing skin cells, such as actinic keratoses. These cells  peel off and are replaced by healthy ones.   5-fluorouracil/calcipotriene is a combination of the 5-fluorouracil cream with a vitamin D analog cream called calcipotriene. The calcipotriene alone does not treat actinic keratoses. However, when it is combined with 5-fluorouracil, it helps the 5-fluorouracil treat the actinic keratoses much faster so that the same results can be achieved with a much shorter treatment time.  INSTRUCTIONS FOR 5-FLUOROURACIL/CALCIPOTRIENE CREAM:   5-fluorouracil/calcipotriene cream typically only needs to be used for 4-7 days. A thin layer should be applied twice a day to the treatment areas recommended by your physician.   If your physician prescribed you separate tubes of 5-fluourouracil and calcipotriene, apply a thin layer of 5-fluorouracil followed by a thin layer of calcipotriene.   Avoid contact with your eyes, nostrils, and mouth. Do not use 5-fluorouracil/calcipotriene cream on infected or open wounds.   You will develop redness, irritation and some crusting at areas where you have pre-cancer damage/actinic keratoses. IF YOU DEVELOP PAIN, BLEEDING, OR SIGNIFICANT CRUSTING, STOP THE TREATMENT EARLY - you have already gotten a good response and the actinic keratoses should clear up well.  Wash your hands after applying 5-fluorouracil 5% cream on your skin.   A moisturizer or sunscreen with a minimum SPF 30 should be applied each morning.   Once you have finished the treatment, you can apply a thin layer of Vaseline twice a day to irritated areas to soothe and calm the areas more quickly. If you experience significant discomfort, contact your physician.  For some patients it is necessary to repeat the treatment for best results.  SIDE EFFECTS:  When using 5-fluorouracil/calcipotriene cream, you may have mild irritation, such as redness, dryness, swelling, or a mild burning sensation. This usually resolves within 2 weeks. The more actinic keratoses you have, the  more redness and inflammation you can expect during treatment. Eye irritation has been reported rarely. If this occurs, please let us know.  If you have any trouble using this cream, please call the office. If you have any other questions about this information, please do not hesitate to ask me before you leave the office.     Cryotherapy Aftercare  Wash gently with soap and water everyday.   Apply Vaseline and Band-Aid daily until healed.    Actinic keratoses are precancerous spots that appear secondary to cumulative UV radiation exposure/sun exposure over time. They are chronic with expected duration over 1 year. A portion of actinic keratoses will progress to squamous cell carcinoma of the skin. It is not possible to reliably predict which spots will progress to skin cancer and so treatment is recommended to prevent development of skin cancer.  Recommend daily broad spectrum sunscreen SPF 30+ to sun-exposed areas, reapply every 2 hours as needed.  Recommend staying in the shade or wearing long sleeves, sun glasses (UVA+UVB protection) and wide brim hats (4-inch brim around the entire circumference of the hat). Call for new or changing lesions.    Seborrheic Keratosis  What causes seborrheic keratoses? Seborrheic keratoses are harmless, common skin growths that first appear during adult life.  As time goes by, more growths appear.  Some people may develop a large number of them.  Seborrheic keratoses appear on both covered and uncovered body parts.  They are not caused by sunlight.  The tendency to develop seborrheic keratoses can be inherited.  They vary in color from skin-colored to gray, brown, or even black.  They can be either smooth or have a rough, warty surface.   Seborrheic keratoses are superficial and look as if they were stuck on the skin.  Under the microscope this type of keratosis looks like layers upon layers of skin.  That is why at times the top layer may seem to fall off,  but the rest of the growth remains and re-grows.    Treatment Seborrheic keratoses do not need to be treated, but can easily be removed in the office.  Seborrheic keratoses often cause symptoms when they rub on clothing or jewelry.  Lesions can be in the way of shaving.  If they become inflamed, they can cause itching, soreness, or burning.  Removal of a seborrheic keratosis can be accomplished by freezing, burning, or surgery. If any spot bleeds, scabs, or grows rapidly, please return to have it checked, as these can be an indication of a skin cancer.     Due to recent changes in healthcare laws, you may see results of your pathology and/or laboratory studies on MyChart before the doctors have had a chance to review them. We understand that in some cases there may be results that are confusing or concerning to you. Please understand that not all results are received at the same time and often the doctors may need to interpret multiple results in order to provide you with the best plan of care or course of treatment. Therefore, we ask that you please give [place] 2 business days to thoroughly review all your results before contacting the office for clarification. Should we see a critical lab result, you will be contacted sooner.   If You Need Anything After Your  Visit  If you have any questions or concerns for your doctor, please call our main line at [phone] and press option 4 to reach your doctor's medical assistant. If no one answers, please leave a voicemail as directed and we will return your call as soon as possible. Messages left after 4 pm will be answered the following business day.   You may also send [place] a message via MyChart. We typically respond to MyChart messages within 1-2 business days.  For prescription refills, please ask your pharmacy to contact our office. Our fax number is [phone].  If you have an urgent issue when the clinic is closed that cannot wait until the next  business day, you can page your doctor at the number below.    Please note that while we do our best to be available for urgent issues outside of office hours, we are not available 24/7.   If you have an urgent issue and are unable to reach [place], you may choose to seek medical care at your doctor's office, retail clinic, urgent care center, or emergency room.  If you have a medical emergency, please immediately call 911 or go to the emergency department.  Pager Numbers  - Dr. Gwen Pounds: [phone]  - Dr. Roseanne Reno: [phone]  - Dr. Katrinka Blazing: [phone]   In the event of inclement weather, please call our main line at [phone] for an update on the status of any delays or closures.  Dermatology Medication Tips: Please keep the boxes that topical medications come in in order to help keep track of the instructions about where and how to use these. Pharmacies typically print the medication instructions only on the boxes and not directly on the medication tubes.   If your medication is too expensive, please contact our office at [phone] option 4 or send [place] a message through MyChart.   We are unable to tell what your co-pay for medications will be in advance as this is different depending on your insurance coverage. However, we may be able to find a substitute medication at lower cost or fill out paperwork to get insurance to cover a needed medication.   If a prior authorization is required to get your medication covered by your insurance company, please allow [place] 1-2 business days to complete this process.  Drug prices often vary depending on where the prescription is filled and some pharmacies may offer cheaper prices.  The website www.goodrx.com contains coupons for medications through different pharmacies. The prices here do not account for what the cost may be with help from insurance (it may be cheaper with your insurance), but the website can give you the price if you did not use  any insurance.  - You can print the associated coupon and take it with your prescription to the pharmacy.  - You may also stop by our office during regular business hours and pick up a GoodRx coupon card.  - If you need your prescription sent electronically to a different pharmacy, notify our office through [facility] or by phone at [phone] option 4.     Si Usted Necesita Algo Despus de Su Visita  Tambin puede enviarnos un mensaje a travs de Clinical cytogeneticist. Por lo general respondemos a los mensajes de MyChart en el transcurso de 1 a 2 das hbiles.  Para renovar recetas, por favor pida a su farmacia que se ponga en contacto con nuestra oficina. Annie Sable de fax es [place] [phone].  Si tiene un asunto urgente cuando  la clnica est cerrada y que no puede esperar hasta el siguiente da hbil, puede llamar/localizar a su doctor(a) al nmero que aparece a continuacin.   Por favor, tenga en cuenta que aunque hacemos todo lo posible para estar disponibles para asuntos urgentes fuera del horario de [place], no estamos disponibles las 24 horas del da, los 7 [address]  [address] de la [place].   Si tiene un problema urgente y no puede comunicarse con nosotros, puede optar por buscar atencin mdica  en el consultorio de su doctor(a), en una clnica privada, en un centro de atencin urgente o en una sala de emergencias.  Si tiene Engineer, drilling, por favor llame inmediatamente al 911 o vaya a la sala de emergencias.  Nmeros de bper  - Dr. Gwen Pounds: [phone]  - Dra. Roseanne Reno: [phone]  - Dr. Katrinka Blazing: [phone]   En caso de inclemencias del tiempo, por favor llame a Lacy Duverney principal al [phone] para una actualizacin sobre el [place] de cualquier retraso o cierre.  Consejos para la medicacin en dermatologa: Por favor, guarde las cajas en las que vienen los medicamentos de uso tpico para ayudarle a seguir las instrucciones sobre dnde y cmo usarlos. Las farmacias  generalmente imprimen las instrucciones del medicamento slo en las cajas y no directamente en los tubos del [place].   Si su medicamento es muy caro, por favor, pngase en contacto con Rolm Gala llamando al [phone] y presione la opcin 4 o envenos un mensaje a travs de Clinical cytogeneticist.   No podemos decirle cul ser su copago por los medicamentos por adelantado ya que esto es diferente dependiendo de la cobertura de su seguro. Sin embargo, es posible que podamos encontrar un medicamento sustituto a Audiological scientist un formulario para que el seguro cubra el medicamento que se considera necesario.   Si se requiere una autorizacin previa para que su compaa de seguros [place] su medicamento, por favor permtanos de 1 a 2 das hbiles para completar [address]  Los precios de los medicamentos varan con frecuencia dependiendo del Environmental consultant de dnde se surte la receta y alguna farmacias pueden ofrecer precios ms baratos.  El sitio web www.goodrx.com tiene cupones para medicamentos de Health and safety inspector. Los precios aqu no tienen en cuenta lo que podra costar con la ayuda del seguro (puede ser ms barato con su seguro), pero el sitio web puede darle el precio si no utiliz Tourist information centre manager.  - Puede imprimir el cupn correspondiente y llevarlo con su receta a la farmacia.  - Tambin puede pasar por nuestra oficina durante el horario de atencin regular y Education officer, museum una tarjeta de cupones de GoodRx.  - Si necesita que su receta se enve electrnicamente a una farmacia diferente, informe a nuestra oficina a travs de MyChart de [place] o por telfono llamando al [phone] y presione la opcin 4.

## 2023-09-15 NOTE — Progress Notes (Signed)
 Follow-Up Visit   Subjective  Johnathan Dominguez is a 73 y.o. male who presents for the following:  Here for 6 month ak follow up Hx of spots at scalp and right arm   The patient has spots, moles and lesions to be evaluated, some may be new or changing and the patient may have concern these could be cancer.  The following portions of the chart were reviewed this encounter and updated as appropriate: medications, allergies, medical history  Review of Systems:  No other skin or systemic complaints except as noted in HPI or Assessment and Plan.  Objective  Well appearing patient in no apparent distress; mood and affect are within normal limits.  A focused examination was performed of the following areas: B/l arms , scalp, face, hands, ears  Relevant exam findings are noted in the Assessment and Plan.  scalp and face x 18 (18) Erythematous thin papules/macules with gritty scale.  scalp and face x 18 (18) Erythematous stuck-on, waxy papule or plaque  Assessment & Plan   SEBORRHEIC KERATOSIS - Stuck-on, waxy, tan-brown papules and/or plaques  - Benign-appearing - Discussed benign etiology and prognosis. - Observe - Call for any changes  Purpura - Chronic; persistent and recurrent.  Treatable, but not curable. - Violaceous macules and patches - Benign - Related to trauma, age, sun damage and/or use of blood thinners, chronic use of topical and/or oral steroids - Observe - Can use OTC arnica containing moisturizer such as Dermend Bruise Formula if desired - Call for worsening or other concerns  ACTINIC KERATOSIS (18) scalp and face x 18 (18) Aks at scalp and face (mostly scalp)  Repeat  - Start 5-fluorouracil/calcipotriene cream twice a day for 10 days to affected areas including scalp and face . Prescription sent to [facility]. Patient advised they will receive an email to purchase the medication online and have it sent to their home. Patient  provided with handout reviewing treatment course and side effects and advised to call or message [place] on MyChart with any concerns.  Reviewed course of treatment and expected reaction.  Patient advised to expect inflammation and crusting and advised that erosions are possible.  Patient advised to be diligent with sun protection during and after treatment. Counseled to keep medication out of reach of children and pets.  ACTINIC DAMAGE WITH PRECANCEROUS ACTINIC KERATOSES Counseling for Topical Chemotherapy Management: Patient exhibits: - Severe, confluent actinic changes with pre-cancerous actinic keratoses that is secondary to cumulative UV radiation exposure over time - Condition that is severe; chronic, not at goal. - diffuse scaly erythematous macules and papules with underlying dyspigmentation - Discussed Prescription "Field Treatment" topical Chemotherapy for Severe, Chronic Confluent Actinic Changes with Pre-Cancerous Actinic Keratoses Field treatment involves treatment of an entire area of skin that has confluent Actinic Changes (Sun/ Ultraviolet light damage) and PreCancerous Actinic Keratoses by method of PhotoDynamic Therapy (PDT) and/or prescription Topical Chemotherapy agents such as 5-fluorouracil, 5-fluorouracil/calcipotriene, and/or imiquimod.  The purpose is to decrease the number of clinically evident and subclinical PreCancerous lesions to prevent progression to development of skin cancer by chemically destroying early precancer changes that may or may not be visible.  It has been shown to reduce the risk of developing skin cancer in the treated area. As a result of treatment, redness, scaling, crusting, and open sores may occur during treatment course. One or more than one of these methods may be used and may have to be used several times to control, suppress and  eliminate the PreCancerous changes. Discussed treatment course, expected reaction, and possible side effects. - Recommend daily  broad spectrum sunscreen SPF 30+ to sun-exposed areas, reapply every 2 hours as needed.  - Staying in the shade or wearing long sleeves, sun glasses (UVA+UVB protection) and wide brim hats (4-inch brim around the entire circumference of the hat) are also recommended. - Call for new or changing lesions.  Actinic keratoses are precancerous spots that appear secondary to cumulative UV radiation exposure/sun exposure over time. They are chronic with expected duration over 1 year. A portion of actinic keratoses will progress to squamous cell carcinoma of the skin. It is not possible to reliably predict which spots will progress to skin cancer and so treatment is recommended to prevent development of skin cancer.  Recommend daily broad spectrum sunscreen SPF 30+ to sun-exposed areas, reapply every 2 hours as needed.  Recommend staying in the shade or wearing long sleeves, sun glasses (UVA+UVB protection) and wide brim hats (4-inch brim around the entire circumference of the hat). Call for new or changing lesions. Destruction of lesion - scalp and face x 18 (18) Complexity: simple   Destruction method: cryotherapy   Informed consent: discussed and consent obtained   Timeout:  patient name, date of birth, surgical site, and procedure verified Lesion destroyed using liquid nitrogen: Yes   Region frozen until ice ball extended beyond lesion: Yes   Outcome: patient tolerated procedure well with no complications   Post-procedure details: wound care instructions given   INFLAMED SEBORRHEIC KERATOSIS (18) scalp and face x 18 (18) Symptomatic, irritating, patient would like treated. Destruction of lesion - scalp and face x 18 (18) Complexity: simple   Destruction method: cryotherapy   Informed consent: discussed and consent obtained   Timeout:  patient name, date of birth, surgical site, and procedure verified Lesion destroyed using liquid nitrogen: Yes   Region frozen until ice ball extended beyond  lesion: Yes   Outcome: patient tolerated procedure well with no complications   Post-procedure details: wound care instructions given    Return for 6 - 8 month ak and isk follow up.  IAsher Muir, CMA, am acting as scribe for Armida Sans, MD.   Documentation: I have reviewed the above documentation for accuracy and completeness, and I agree with the above.  Armida Sans, MD

## 2023-10-07 ENCOUNTER — Other Ambulatory Visit: Payer: Self-pay | Admitting: Primary Care

## 2023-10-07 DIAGNOSIS — M1990 Unspecified osteoarthritis, unspecified site: Secondary | ICD-10-CM

## 2023-11-21 ENCOUNTER — Other Ambulatory Visit: Payer: Self-pay | Admitting: Nurse Practitioner

## 2023-11-21 DIAGNOSIS — E119 Type 2 diabetes mellitus without complications: Secondary | ICD-10-CM

## 2023-11-27 ENCOUNTER — Other Ambulatory Visit: Payer: Self-pay | Admitting: Primary Care

## 2023-11-27 DIAGNOSIS — F411 Generalized anxiety disorder: Secondary | ICD-10-CM

## 2023-11-27 DIAGNOSIS — E785 Hyperlipidemia, unspecified: Secondary | ICD-10-CM

## 2023-12-10 ENCOUNTER — Other Ambulatory Visit: Payer: Self-pay | Admitting: Primary Care

## 2023-12-10 DIAGNOSIS — N400 Enlarged prostate without lower urinary tract symptoms: Secondary | ICD-10-CM

## 2023-12-29 ENCOUNTER — Other Ambulatory Visit: Payer: Self-pay | Admitting: Primary Care

## 2023-12-29 DIAGNOSIS — I10 Essential (primary) hypertension: Secondary | ICD-10-CM

## 2024-01-03 ENCOUNTER — Other Ambulatory Visit: Payer: Self-pay | Admitting: Primary Care

## 2024-01-03 DIAGNOSIS — I10 Essential (primary) hypertension: Secondary | ICD-10-CM

## 2024-01-12 ENCOUNTER — Encounter: Payer: Self-pay | Admitting: Primary Care

## 2024-01-12 ENCOUNTER — Ambulatory Visit (INDEPENDENT_AMBULATORY_CARE_PROVIDER_SITE_OTHER): Payer: Medicare Other | Admitting: Primary Care

## 2024-01-12 VITALS — BP 130/82 | HR 92 | Temp 97.3°F | Ht 66.0 in | Wt 219.0 lb

## 2024-01-12 DIAGNOSIS — E785 Hyperlipidemia, unspecified: Secondary | ICD-10-CM

## 2024-01-12 DIAGNOSIS — I10 Essential (primary) hypertension: Secondary | ICD-10-CM

## 2024-01-12 DIAGNOSIS — M1990 Unspecified osteoarthritis, unspecified site: Secondary | ICD-10-CM

## 2024-01-12 DIAGNOSIS — N400 Enlarged prostate without lower urinary tract symptoms: Secondary | ICD-10-CM

## 2024-01-12 DIAGNOSIS — M25561 Pain in right knee: Secondary | ICD-10-CM

## 2024-01-12 DIAGNOSIS — M1A9XX0 Chronic gout, unspecified, without tophus (tophi): Secondary | ICD-10-CM

## 2024-01-12 DIAGNOSIS — Z125 Encounter for screening for malignant neoplasm of prostate: Secondary | ICD-10-CM

## 2024-01-12 DIAGNOSIS — E1165 Type 2 diabetes mellitus with hyperglycemia: Secondary | ICD-10-CM

## 2024-01-12 DIAGNOSIS — F411 Generalized anxiety disorder: Secondary | ICD-10-CM

## 2024-01-12 DIAGNOSIS — F331 Major depressive disorder, recurrent, moderate: Secondary | ICD-10-CM

## 2024-01-12 DIAGNOSIS — M25562 Pain in left knee: Secondary | ICD-10-CM

## 2024-01-12 DIAGNOSIS — G8929 Other chronic pain: Secondary | ICD-10-CM

## 2024-01-12 LAB — LIPID PANEL
Cholesterol: 159 mg/dL (ref 0–200)
HDL: 38.9 mg/dL — ABNORMAL LOW (ref >39.00)
LDL Cholesterol: 90 mg/dL (ref 0–99)
NonHDL: 120.17
Total CHOL/HDL Ratio: 4
Triglycerides: 149 mg/dL (ref 0.0–149.0)
VLDL: 29.8 mg/dL (ref 0.0–40.0)

## 2024-01-12 LAB — PSA, MEDICARE: PSA: 3.76 ng/mL (ref 0.10–4.00)

## 2024-01-12 LAB — COMPREHENSIVE METABOLIC PANEL WITH GFR
ALT: 19 U/L (ref 0–53)
AST: 14 U/L (ref 0–37)
Albumin: 4.4 g/dL (ref 3.5–5.2)
Alkaline Phosphatase: 54 U/L (ref 39–117)
BUN: 24 mg/dL — ABNORMAL HIGH (ref 6–23)
CO2: 25 meq/L (ref 19–32)
Calcium: 9.6 mg/dL (ref 8.4–10.5)
Chloride: 100 meq/L (ref 96–112)
Creatinine, Ser: 1.24 mg/dL (ref 0.40–1.50)
GFR: 57.22 mL/min — ABNORMAL LOW (ref >60.00)
Glucose, Bld: 144 mg/dL — ABNORMAL HIGH (ref 70–99)
Potassium: 4 meq/L (ref 3.5–5.1)
Sodium: 135 meq/L (ref 135–145)
Total Bilirubin: 0.4 mg/dL (ref 0.2–1.2)
Total Protein: 6.6 g/dL (ref 6.0–8.3)

## 2024-01-12 LAB — MICROALBUMIN / CREATININE URINE RATIO
Creatinine,U: 186.6 mg/dL
Microalb Creat Ratio: UNDETERMINED mg/g (ref 0.0–30.0)
Microalb, Ur: <0.7 mg/dL

## 2024-01-12 LAB — HEMOGLOBIN A1C: Hgb A1c MFr Bld: 7.5 % — ABNORMAL HIGH (ref 4.6–6.5)

## 2024-01-12 LAB — URIC ACID: Uric Acid, Serum: 6.8 mg/dL (ref 4.0–7.8)

## 2024-01-12 NOTE — Assessment & Plan Note (Signed)
Controlled.  Continue venlafaxine ER 150 mg daily.

## 2024-01-12 NOTE — Assessment & Plan Note (Signed)
 Repeat lipid panel pending.  Statin intolerant.  Continue Zetia  10 mg daily

## 2024-01-12 NOTE — Assessment & Plan Note (Signed)
 Stable.  Continue meloxicam  15 mg daily. Renal function pending.

## 2024-01-12 NOTE — Assessment & Plan Note (Signed)
Stable. Continue tamsulosin 0.4 mg daily.

## 2024-01-12 NOTE — Assessment & Plan Note (Signed)
 Repeat A1C pending.  Continue metformin  ER 1000 mg daily, glipizide  XL 2.5 mg daily. Urine micro due and pending.  Follow up in 6 months.

## 2024-01-12 NOTE — Patient Instructions (Signed)
 Stop by the lab prior to leaving today. I will notify you of your results once received.   Please schedule a follow up visit for 6 months for a diabetes check.  It was a pleasure to see you today!

## 2024-01-12 NOTE — Assessment & Plan Note (Signed)
Controlled.  Continue amlodipine 5 mg daily, valsartan-hydrochlorothiazide 160-12.5 mg daily. CMP pending.

## 2024-01-12 NOTE — Assessment & Plan Note (Signed)
 Controlled.  Continue Meloxicam  15 mg daily

## 2024-01-12 NOTE — Progress Notes (Signed)
 Subjective:    Patient ID: Johnathan Dominguez, male    DOB: 11-27-1950, 73 y.o.   MRN: [id number]  HPI  Johnathan Dominguez is a very pleasant 73 y.o. male with a history of hypertension, type 2 diabetes, BPH, osteoarthritis, hyperlipidemia, GAD, MDD who presents today for follow-up of chronic conditions  He is due for colonoscopy. He declines both colonoscopy and Cologuard.   1) Hypertension/Hyperlipidemia: Currently managed on valsartan -hydrochlorothiazide  160-12.5 mg daily, amlodipine  5 mg daily, Zetia  10 mg daily.  BP Readings from Last 3 Encounters:  01/12/24 130/82  07/14/23 118/74  01/08/23 (!) 94/56     2) GAD/MDD: Currently managed on venlafaxine  ER 150 mg daily. Overall feels well managed on this regimen.   3) BPH: Currently managed on tamsulosin  0.4 mg daily. He feels well managed on this regimen. Denies concerns.   4) Osteoarthritis: Currently managed on meloxicam  15 mg daily. He takes this daily which helps with overall joint pain.   5) Type 2 Diabetes:  Current medications include: Glipizide  XL 2.5 mg daily, metformin  ER 1000 mg daily  Last A1C: 7.0 in January 2025 Last Eye Exam: Due Last Foot Exam: Up-to-date Pneumonia Vaccination: 2024 Urine Microalbumin: Due Statin: Intolerant   Review of Systems  Respiratory:  Negative for shortness of breath.   Cardiovascular:  Negative for chest pain.  Gastrointestinal:  Negative for constipation and diarrhea.  Neurological:  Negative for dizziness and numbness.  Psychiatric/Behavioral:  The patient is not nervous/anxious.          Past Medical History:  Diagnosis Date   Acute constipation 11/18/2019   Allergic rhinitis    BPH (benign prostatic hyperplasia)    Essential hypertension    GAD (generalized anxiety disorder)    Hyperlipidemia    Loss of taste 07/17/2021   Osteoarthritis     Social History   Socioeconomic History   Marital status: Married    Spouse name: Not on file   Number of  children: Not on file   Years of education: Not on file   Highest education level: 12th grade  Occupational History   Not on file  Tobacco Use   Smoking status: Never   Smokeless tobacco: Never  Vaping Use   Vaping status: Never Used  Substance and Sexual Activity   Alcohol use: Not Currently   Drug use: Never   Sexual activity: Not on file  Other Topics Concern   Not on file  Social History Narrative   Not on file   Social Drivers of Health   Financial Resource Strain: Low Risk  (01/08/2024)   Overall Financial Resource Strain (CARDIA)    Difficulty of Paying Living Expenses: Not hard at all  Food Insecurity: No Food Insecurity (01/08/2024)   Hunger Vital Sign    Worried About Running Out of Food in the Last Year: Never true    Ran Out of Food in the Last Year: Never true  Transportation Needs: No Transportation Needs (01/08/2024)   PRAPARE - Administrator, Civil Service (Medical): No    Lack of Transportation (Non-Medical): No  Physical Activity: Inactive (01/08/2024)   Exercise Vital Sign    Days of Exercise per Week: 0 days    Minutes of Exercise per Session: Not on file  Stress: No Stress Concern Present (01/08/2024)   [employer] of Occupational Health - Occupational Stress Questionnaire    Feeling of Stress: Not at all  Social Connections: Moderately Isolated (01/08/2024)   Social Connection and  Isolation Panel    Frequency of Communication with Friends and Family: Twice a week    Frequency of Social Gatherings with Friends and Family: Once a week    Attends Religious Services: Never    Database administrator or Organizations: No    Attends Engineer, structural: Not on file    Marital Status: Married  Catering manager Violence: Not At Risk (12/29/2022)   Humiliation, Afraid, Rape, and Kick questionnaire    Fear of Current or Ex-Partner: No    Emotionally Abused: No    Physically Abused: No    Sexually Abused: No    Past Surgical  History:  Procedure Laterality Date   CERVICAL SPINE SURGERY     GALLBLADDER SURGERY  1979   HERNIA REPAIR     TONSILLECTOMY AND ADENOIDECTOMY      Family History  Problem Relation Age of Onset   Heart disease Mother    Hypertension Mother    Heart disease Maternal Grandfather     Allergies  Allergen Reactions   Statins     Current Outpatient Medications on File Prior to Visit  Medication Sig Dispense Refill   amLODipine  (NORVASC ) 5 MG tablet Take 1 tablet by mouth once daily for blood pressure 90 tablet 0   BESIVANCE 0.6 % SUSP Place 1 drop into the right eye 3 (three) times daily.     ezetimibe  (ZETIA ) 10 MG tablet TAKE 1 TABLET BY MOUTH ONCE DAILY FOR CHOLESTEROL 90 tablet 0   fluticasone  (FLONASE) 50 MCG/ACT nasal spray Place 2 sprays into both nostrils daily.     glipiZIDE  (GLUCOTROL  XL) 2.5 MG 24 hr tablet TAKE 1 TABLET BY MOUTH ONCE DAILY WITH BREAKFAST FOR DIABETES 90 tablet 1   glucose blood (ONETOUCH ULTRA) test strip USE TO TEST BLOOD SUGAR TWO TO THREE TIMES DAILY 100 each 0   Lancet Devices (ONE TOUCH DELICA LANCING DEV) MISC Use as instructed to test blood sugar daily 100 each 5   meloxicam  (MOBIC ) 15 MG tablet TAKE 1 TABLET BY MOUTH ONCE DAILY AS NEEDED FOR PAIN 90 tablet 0   metFORMIN  (GLUCOPHAGE -XR) 500 MG 24 hr tablet TAKE 2 TABLETS BY MOUTH ONCE DAILY WITH BREAKFAST FOR DIABETES 180 tablet 0   RESTASIS 0.05 % ophthalmic emulsion 1 drop 2 (two) times daily.     tamsulosin  (FLOMAX ) 0.4 MG CAPS capsule TAKE 1 CAPSULE BY MOUTH ONCE DAILY AFTER  SUPPER  FOR  URINATION 90 capsule 0   valsartan -hydrochlorothiazide  (DIOVAN -HCT) 160-12.5 MG tablet Take 1 tablet by mouth once daily for blood pressure 90 tablet 0   venlafaxine  XR (EFFEXOR -XR) 150 MG 24 hr capsule TAKE 1 CAPSULE BY MOUTH ONCE DAILY WITH BREAKFAST FOR ANXIETY 90 capsule 0   fluorouracil  (EFUDEX ) 5 % cream Apply to the entire scalp twice a day x 7 days (Patient not taking: Reported on 01/12/2024) 30 g 2   No  current facility-administered medications on file prior to visit.    BP 130/82   Pulse 92   Temp (!) 97.3 F (36.3 C) (Temporal)   Ht 5' 6 (1.676 m)   Wt 219 lb (99.3 kg)   SpO2 96%   BMI 35.35 kg/m  Objective:   Physical Exam Cardiovascular:     Rate and Rhythm: Normal rate and regular rhythm.  Pulmonary:     Effort: Pulmonary effort is normal.     Breath sounds: Normal breath sounds.  Abdominal:     General: Bowel sounds are normal.  Palpations: Abdomen is soft.     Tenderness: There is no abdominal tenderness.  Musculoskeletal:     Cervical back: Neck supple.  Skin:    General: Skin is warm and dry.  Neurological:     Mental Status: He is alert and oriented to person, place, and time.  Psychiatric:        Mood and Affect: Mood normal.           Assessment & Plan:  Type 2 diabetes mellitus with hyperglycemia, without long-term current use of insulin (HCC) Assessment & Plan: Repeat A1C pending.  Continue metformin  ER 1000 mg daily, glipizide  XL 2.5 mg daily. Urine micro due and pending.  Follow up in 6 months.  Orders: -     Microalbumin / creatinine urine ratio -     Hemoglobin A1c  Essential hypertension Assessment & Plan: Controlled.  Continue amlodipine  5 mg daily, valsartan -hydrochlorothiazide  160-12.5 mg daily CMP pending.   Chronic gout without tophus, unspecified cause, unspecified site Assessment & Plan: No recent gout flares.  Repeat uric acid pending.  Orders: -     Uric acid  Hyperlipidemia, unspecified hyperlipidemia type Assessment & Plan: Repeat lipid panel pending.  Statin intolerant.  Continue Zetia  10 mg daily  Orders: -     Lipid panel -     Comprehensive metabolic panel with GFR  Screening for prostate cancer -     PSA, Medicare  Osteoarthritis, unspecified osteoarthritis type, unspecified site Assessment & Plan: Stable.  Continue meloxicam  15 mg daily. Renal function pending.   Benign prostatic  hyperplasia without lower urinary tract symptoms Assessment & Plan: Stable.  Continue tamsulosin  0.4 mg daily   Chronic pain of both knees Assessment & Plan: Controlled.  Continue Meloxicam  15 mg daily   GAD (generalized anxiety disorder) Assessment & Plan: Controlled.  Continue venlafaxine  ER 150 mg daily.   Moderate episode of recurrent major depressive disorder [facility]) Assessment & Plan: Controlled.  Continue venlafaxine  ER 150 mg daily     Declines colonoscopy and Cologuard.     Berlie Hatchel K Oscar Forman, NP

## 2024-01-12 NOTE — Assessment & Plan Note (Signed)
 No recent gout flares.  Repeat uric acid pending.

## 2024-01-13 ENCOUNTER — Ambulatory Visit: Payer: Self-pay | Admitting: Primary Care

## 2024-01-13 DIAGNOSIS — E1165 Type 2 diabetes mellitus with hyperglycemia: Secondary | ICD-10-CM

## 2024-01-13 MED ORDER — GLIPIZIDE ER 5 MG PO TB24: 5.0000 mg | ORAL_TABLET | Freq: Every day | ORAL | 1 refills | Status: AC

## 2024-01-21 ENCOUNTER — Encounter

## 2024-02-16 ENCOUNTER — Other Ambulatory Visit: Payer: Self-pay | Admitting: Primary Care

## 2024-02-16 DIAGNOSIS — E119 Type 2 diabetes mellitus without complications: Secondary | ICD-10-CM

## 2024-02-21 ENCOUNTER — Other Ambulatory Visit: Payer: Self-pay | Admitting: Primary Care

## 2024-02-21 DIAGNOSIS — F411 Generalized anxiety disorder: Secondary | ICD-10-CM

## 2024-02-21 DIAGNOSIS — E785 Hyperlipidemia, unspecified: Secondary | ICD-10-CM

## 2024-02-21 DIAGNOSIS — E119 Type 2 diabetes mellitus without complications: Secondary | ICD-10-CM

## 2024-03-07 ENCOUNTER — Other Ambulatory Visit: Payer: Self-pay | Admitting: Primary Care

## 2024-03-07 DIAGNOSIS — N400 Enlarged prostate without lower urinary tract symptoms: Secondary | ICD-10-CM

## 2024-03-29 ENCOUNTER — Encounter: Payer: Self-pay | Admitting: Dermatology

## 2024-03-29 ENCOUNTER — Ambulatory Visit (INDEPENDENT_AMBULATORY_CARE_PROVIDER_SITE_OTHER): Admitting: Dermatology

## 2024-03-29 DIAGNOSIS — W908XXA Exposure to other nonionizing radiation, initial encounter: Secondary | ICD-10-CM

## 2024-03-29 DIAGNOSIS — L720 Epidermal cyst: Secondary | ICD-10-CM | POA: Diagnosis not present

## 2024-03-29 DIAGNOSIS — Z7189 Other specified counseling: Secondary | ICD-10-CM

## 2024-03-29 DIAGNOSIS — Z5111 Encounter for antineoplastic chemotherapy: Secondary | ICD-10-CM

## 2024-03-29 DIAGNOSIS — L578 Other skin changes due to chronic exposure to nonionizing radiation: Secondary | ICD-10-CM

## 2024-03-29 DIAGNOSIS — L729 Follicular cyst of the skin and subcutaneous tissue, unspecified: Secondary | ICD-10-CM

## 2024-03-29 DIAGNOSIS — L570 Actinic keratosis: Secondary | ICD-10-CM | POA: Diagnosis not present

## 2024-03-29 DIAGNOSIS — L820 Inflamed seborrheic keratosis: Secondary | ICD-10-CM | POA: Diagnosis not present

## 2024-03-29 DIAGNOSIS — Z79899 Other long term (current) drug therapy: Secondary | ICD-10-CM

## 2024-03-29 NOTE — Patient Instructions (Addendum)
 May 16, 2024 start 5-fluorouracil /calcipotriene cream twice a day for 10 days to affected areas including crown scalp.  Patient provided with handout reviewing treatment course and side effects and advised to call or message us  on MyChart with any concerns.  Instructions for [facility] Medications  One or more of your medications was sent to the [facility] mail order compounding pharmacy. You will receive an email from them and can purchase the medicine through that link. It will then be mailed to your home at the address you confirmed. If for any reason you do not receive an email from them, please check your spam folder. If you still do not find the email, please let us  know. [facility] phone number is [phone].   Cryotherapy Aftercare  Wash gently with soap and water everyday.   Apply Vaseline and Band-Aid daily until healed.   Due to recent changes in healthcare laws, you may see results of your pathology and/or laboratory studies on MyChart before the doctors have had a chance to review them. We understand that in some cases there may be results that are confusing or concerning to you. Please understand that not all results are received at the same time and often the doctors may need to interpret multiple results in order to provide you with the best plan of care or course of treatment. Therefore, we ask that you please give us  2 business days to thoroughly review all your results before contacting the office for clarification. Should we see a critical lab result, you will be contacted sooner.   If You Need Anything After Your Visit  If you have any questions or concerns for your doctor, please call our main line at [phone] and press option 4 to reach your doctor's medical assistant. If no one answers, please leave a voicemail as directed and we will return your call as soon as possible. Messages left after 4 pm will be answered the following business day.   You may  also send us  a message via MyChart. We typically respond to MyChart messages within 1-2 business days.  For prescription refills, please ask your pharmacy to contact our office. Our fax number is [phone].  If you have an urgent issue when the clinic is closed that cannot wait until the next business day, you can page your doctor at the number below.    Please note that while we do our best to be available for urgent issues outside of office hours, we are not available 24/7.   If you have an urgent issue and are unable to reach us , you may choose to seek medical care at your doctor's office, retail clinic, urgent care center, or emergency room.  If you have a medical emergency, please immediately call 911 or go to the emergency department.  Pager Numbers  - Dr. Hester: [phone]  - Dr. Jackquline: [phone]  - Dr. Claudene: [phone]   - Dr. Raymund: [phone]  In the event of inclement weather, please call our main line at [phone] for an update on the status of any delays or closures.  Dermatology Medication Tips: Please keep the boxes that topical medications come in in order to help keep track of the instructions about where and how to use these. Pharmacies typically print the medication instructions only on the boxes and not directly on the medication tubes.   If your medication is too expensive, please contact our office at [phone] option 4 or send us  a message through MyChart.  We are unable to tell what your co-pay for medications will be in advance as this is different depending on your insurance coverage. However, we may be able to find a substitute medication at lower cost or fill out paperwork to get insurance to cover a needed medication.   If a prior authorization is required to get your medication covered by your insurance company, please allow us  1-2 business days to complete this process.  Drug prices often vary depending on where the prescription  is filled and some pharmacies may offer cheaper prices.  The website www.goodrx.com contains coupons for medications through different pharmacies. The prices here do not account for what the cost may be with help from insurance (it may be cheaper with your insurance), but the website can give you the price if you did not use any insurance.  - You can print the associated coupon and take it with your prescription to the pharmacy.  - You may also stop by our office during regular business hours and pick up a GoodRx coupon card.  - If you need your prescription sent electronically to a different pharmacy, notify our office through [facility] or by phone at [phone] option 4.     Si Usted Necesita Algo Despus de Su Visita  Tambin puede enviarnos un mensaje a travs de Clinical cytogeneticist. Por lo general respondemos a los mensajes de MyChart en el transcurso de 1 a 2 das hbiles.  Para renovar recetas, por favor pida a su farmacia que se ponga en contacto con nuestra oficina. Randi lakes de fax es [place] [phone].  Si tiene un asunto urgente cuando la clnica est cerrada y que no puede esperar hasta el siguiente da hbil, puede llamar/localizar a su doctor(a) al nmero que aparece a continuacin.   Por favor, tenga en cuenta que aunque hacemos todo lo posible para estar disponibles para asuntos urgentes fuera del horario de [place], no estamos disponibles las 24 horas del da, los 7 [address]  [address] de la [place].   Si tiene un problema urgente y no puede comunicarse con nosotros, puede optar por buscar atencin mdica  en el consultorio de su doctor(a), en una clnica privada, en un centro de atencin urgente o en una sala de emergencias.  Si tiene Engineer, drilling, por favor llame inmediatamente al 911 o vaya a la sala de emergencias.  Nmeros de bper  - Dr. Hester: [phone]  - Dra. Jackquline: [phone]  - Dr. Claudene: [phone]  - Dra. Kitts: [phone]  En caso de  inclemencias del [place], por favor llame a nuestra lnea principal al [phone] para una actualizacin sobre el estado de cualquier retraso o cierre.  Consejos para la medicacin en dermatologa: Por favor, guarde las cajas en las que vienen los medicamentos de uso tpico para ayudarle a seguir las instrucciones sobre dnde y cmo usarlos. Las farmacias generalmente imprimen las instrucciones del medicamento slo en las cajas y no directamente en los tubos del [place].   Si su medicamento es muy caro, por favor, pngase en contacto con landry rieger llamando al [phone] y presione la opcin 4 o envenos un mensaje a travs de Clinical cytogeneticist.   No podemos decirle cul ser su copago por los medicamentos por adelantado ya que esto es diferente dependiendo de la cobertura de su seguro. Sin embargo, es posible que podamos encontrar un medicamento sustituto a Audiological scientist un formulario para que el seguro cubra el medicamento que se considera necesario.   Si se requiere [employer]  autorizacin previa para que su compaa de seguros malta su medicamento, por favor permtanos de 1 a 2 das hbiles para completar este proceso.  Los precios de los medicamentos varan con frecuencia dependiendo del Environmental consultant de dnde se surte la receta y alguna farmacias pueden ofrecer precios ms baratos.  El sitio web www.goodrx.com tiene cupones para medicamentos de Health and safety inspector. Los precios aqu no tienen en cuenta lo que podra costar con la ayuda del seguro (puede ser ms barato con su seguro), pero el sitio web puede darle el precio si no utiliz Tourist information centre manager.  - Puede imprimir el cupn correspondiente y llevarlo con su receta a la farmacia.  - Tambin puede pasar por nuestra oficina durante el horario de atencin regular y Education officer, museum una tarjeta de cupones de GoodRx.  - Si necesita que su receta se enve electrnicamente a una farmacia diferente, informe a nuestra oficina a travs de MyChart de [place] o  por telfono llamando al [phone] y presione la opcin 4.

## 2024-03-29 NOTE — Progress Notes (Signed)
 Follow-Up Visit   Subjective  Johnathan Dominguez is a 73 y.o. male who presents for the following: AK 80m f/u scalp, face 5FU/Calcipotriene cream used with good reaction, ISK f/u scalp, face, check hard spots arms, check face, breaking out with white bumps  The following portions of the chart were reviewed this encounter and updated as appropriate: medications, allergies, medical history  Review of Systems:  No other skin or systemic complaints except as noted in HPI or Assessment and Plan.  Objective  Well appearing patient in no apparent distress; mood and affect are within normal limits.  A focused examination was performed of the following areas: Scalp, face, arms  Relevant exam findings are noted in the Assessment and Plan.  scalp, ears, face x 12 (12) Pink scaly macules R arm x 1, L arm x 2 (3) Stuck on waxy paps with erythema  Assessment & Plan   AK (ACTINIC KERATOSIS) (12) scalp, ears, face x 12 (12) Actinic keratoses are precancerous spots that appear secondary to cumulative UV radiation exposure/sun exposure over time. They are chronic with expected duration over 1 year. A portion of actinic keratoses will progress to squamous cell carcinoma of the skin. It is not possible to reliably predict which spots will progress to skin cancer and so treatment is recommended to prevent development of skin cancer.  Recommend daily broad spectrum sunscreen SPF 30+ to sun-exposed areas, reapply every 2 hours as needed.  Recommend staying in the shade or wearing long sleeves, sun glasses (UVA+UVB protection) and wide brim hats (4-inch brim around the entire circumference of the hat). Call for new or changing lesions. Destruction of lesion - scalp, ears, face x 12 (12) Complexity: simple   Destruction method: cryotherapy   Informed consent: discussed and consent obtained   Timeout:  patient name, date of birth, surgical site, and procedure verified Lesion destroyed using liquid  nitrogen: Yes   Region frozen until ice ball extended beyond lesion: Yes   Outcome: patient tolerated procedure well with no complications   Post-procedure details: wound care instructions given    INFLAMED SEBORRHEIC KERATOSIS (3) R arm x 1, L arm x 2 (3) Symptomatic, irritating, patient would like treated. Destruction of lesion - R arm x 1, L arm x 2 (3) Complexity: simple   Destruction method: cryotherapy   Informed consent: discussed and consent obtained   Timeout:  patient name, date of birth, surgical site, and procedure verified Lesion destroyed using liquid nitrogen: Yes   Region frozen until ice ball extended beyond lesion: Yes   Outcome: patient tolerated procedure well with no complications   Post-procedure details: wound care instructions given     ACTINIC DAMAGE WITH PRECANCEROUS ACTINIC KERATOSES Counseling for Topical Chemotherapy Management: Patient exhibits: - Severe, confluent actinic changes with pre-cancerous actinic keratoses that is secondary to cumulative UV radiation exposure over time - Condition that is severe; chronic, not at goal. - diffuse scaly erythematous macules and papules with underlying dyspigmentation - Discussed Prescription Field Treatment topical Chemotherapy for Severe, Chronic Confluent Actinic Changes with Pre-Cancerous Actinic Keratoses Field treatment involves treatment of an entire area of skin that has confluent Actinic Changes (Sun/ Ultraviolet light damage) and PreCancerous Actinic Keratoses by method of PhotoDynamic Therapy (PDT) and/or prescription Topical Chemotherapy agents such as 5-fluorouracil , 5-fluorouracil /calcipotriene, and/or imiquimod.  The purpose is to decrease the number of clinically evident and subclinical PreCancerous lesions to prevent progression to development of skin cancer by chemically destroying early precancer changes that may or may  not be visible.  It has been shown to reduce the risk of developing skin cancer  in the treated area. As a result of treatment, redness, scaling, crusting, and open sores may occur during treatment course. One or more than one of these methods may be used and may have to be used several times to control, suppress and eliminate the PreCancerous changes. Discussed treatment course, expected reaction, and possible side effects. - Recommend daily broad spectrum sunscreen SPF 30+ to sun-exposed areas, reapply every 2 hours as needed.  - Staying in the shade or wearing long sleeves, sun glasses (UVA+UVB protection) and wide brim hats (4-inch brim around the entire circumference of the hat) are also recommended. - Call for new or changing lesions.  - May 16, 2024 - Start 5-fluorouracil /calcipotriene cream twice a day for 10 days to affected areas including crown scalp.  Patient provided with handout reviewing treatment course and side effects and advised to call or message us  on MyChart with any concerns.  Reviewed course of treatment and expected reaction.  Patient advised to expect inflammation and crusting and advised that erosions are possible.  Patient advised to be diligent with sun protection during and after treatment. Counseled to keep medication out of reach of children and pets.   Milia face - tiny firm white papules - type of cyst - benign - sometimes these will clear with nightly OTC adapalene/Differin 0.1% gel or retinol. - may be extracted if symptomatic - observe   Return in about 6 months (around 09/27/2024) for AK f/u, milia f/u.  I, Grayce Saunas, RMA, am acting as scribe for Alm Rhyme, MD .   Documentation: I have reviewed the above documentation for accuracy and completeness, and I agree with the above.  Alm Rhyme, MD

## 2024-04-02 ENCOUNTER — Other Ambulatory Visit: Payer: Self-pay | Admitting: Primary Care

## 2024-04-02 DIAGNOSIS — I10 Essential (primary) hypertension: Secondary | ICD-10-CM

## 2024-04-21 ENCOUNTER — Other Ambulatory Visit (INDEPENDENT_AMBULATORY_CARE_PROVIDER_SITE_OTHER)

## 2024-04-21 ENCOUNTER — Ambulatory Visit: Payer: Self-pay | Admitting: Primary Care

## 2024-04-21 DIAGNOSIS — E1165 Type 2 diabetes mellitus with hyperglycemia: Secondary | ICD-10-CM

## 2024-04-21 LAB — POCT GLYCOSYLATED HEMOGLOBIN (HGB A1C): Hemoglobin A1C: 7.2 % — AB (ref 4.0–5.6)

## 2024-05-06 ENCOUNTER — Ambulatory Visit (INDEPENDENT_AMBULATORY_CARE_PROVIDER_SITE_OTHER)

## 2024-05-06 VITALS — Ht 66.0 in | Wt 219.0 lb

## 2024-05-06 DIAGNOSIS — Z1211 Encounter for screening for malignant neoplasm of colon: Secondary | ICD-10-CM

## 2024-05-06 DIAGNOSIS — Z0000 Encounter for general adult medical examination without abnormal findings: Secondary | ICD-10-CM

## 2024-05-06 NOTE — Progress Notes (Signed)
 Chief Complaint  Patient presents with   Medicare Wellness     Subjective:  Please attest and cosign this visit due to patients primary care provider not being in the office at the time the visit was completed.  (Pt of Comer Gaskins)   Johnathan Dominguez is a 73 y.o. male who presents for a Medicare Annual Wellness Visit.  I connected with  Johnathan Dominguez on 05/06/24 by a audio enabled telemedicine application and verified that I am speaking with the correct person using two identifiers.  Patient Location: Home  Provider Location: Office/Clinic  Persons Participating in Visit: Patient.  I discussed the limitations of evaluation and management by telemedicine. The patient expressed understanding and agreed to proceed.  Vital Signs: Because this visit was a virtual/telehealth visit, some criteria may be missing or patient reported. Any vitals not documented were not able to be obtained and vitals that have been documented are patient reported.   Allergies (verified) Statins   History: Past Medical History:  Diagnosis Date   Acute constipation 11/18/2019   Allergic rhinitis    BPH (benign prostatic hyperplasia)    Essential hypertension    GAD (generalized anxiety disorder)    Hyperlipidemia    Loss of taste 07/17/2021   Osteoarthritis    Past Surgical History:  Procedure Laterality Date   CERVICAL SPINE SURGERY     GALLBLADDER SURGERY  1979   HERNIA REPAIR     TONSILLECTOMY AND ADENOIDECTOMY     Family History  Problem Relation Age of Onset   Heart disease Mother    Hypertension Mother    Heart disease Maternal Grandfather    Social History   Occupational History   Not on file  Tobacco Use   Smoking status: Never   Smokeless tobacco: Never  Vaping Use   Vaping status: Never Used  Substance and Sexual Activity   Alcohol use: Not Currently   Drug use: Never   Sexual activity: Yes   Tobacco Counseling Counseling given: Not Answered  SDOH  Screenings   Food Insecurity: No Food Insecurity (05/06/2024)  Housing: Unknown (05/06/2024)  Transportation Needs: No Transportation Needs (05/06/2024)  Utilities: Not At Risk (05/06/2024)  Alcohol Screen: Low Risk  (12/29/2022)  Depression (PHQ2-9): Low Risk  (05/06/2024)  Financial Resource Strain: Low Risk  (01/08/2024)  Physical Activity: Inactive (05/06/2024)  Social Connections: Moderately Isolated (05/06/2024)  Stress: No Stress Concern Present (05/06/2024)  Tobacco Use: Low Risk  (05/06/2024)  Health Literacy: Adequate Health Literacy (05/06/2024)   See flowsheets for full screening details  Depression Screen PHQ 2 & 9 Depression Scale- Over the past 2 weeks, how often have you been bothered by any of the following problems? Little interest or pleasure in doing things: 0 Feeling down, depressed, or hopeless (PHQ Adolescent also includes...irritable): 0 PHQ-2 Total Score: 0 Trouble falling or staying asleep, or sleeping too much: 0 Feeling tired or having little energy: 0 Poor appetite or overeating (PHQ Adolescent also includes...weight loss): 0 Feeling bad about yourself - or that you are a failure or have let yourself or your family down: 0 Trouble concentrating on things, such as reading the newspaper or watching television (PHQ Adolescent also includes...like school work): 0 Moving or speaking so slowly that other people could have noticed. Or the opposite - being so fidgety or restless that you have been moving around a lot more than usual: 0 Thoughts that you would be better off dead, or of hurting yourself in some way: 0  PHQ-9 Total Score: 0 If you checked off any problems, how difficult have these problems made it for you to do your work, take care of things at home, or get along with other people?: Not difficult at all  Depression Treatment Depression Interventions/Treatment : EYV7-0 Score <4 Follow-up Not Indicated     Goals Addressed               This  Visit's Progress     Patient Stated (pt-stated)        Patient stated he plans to continue to meds daily       Visit info / Clinical Intake: Medicare Wellness Visit Type:: Subsequent Annual Wellness Visit Persons participating in visit:: patient Medicare Wellness Visit Mode:: Telephone If telephone:: video declined Because this visit was a virtual/telehealth visit:: vitals recorded from last visit If Telephone or Video please confirm:: I connected with the patient using audio enabled telemedicine application and verified that I am speaking with the correct person using two identifiers; I discussed the limitations of evaluation and management by telemedicine; The patient expressed understanding and agreed to proceed Patient Location:: Home Provider Location:: Office Information given by:: patient Interpreter Needed?: No Pre-visit prep was completed: yes AWV questionnaire completed by patient prior to visit?: no Living arrangements:: lives with spouse/significant other Patient's Overall Health Status Rating: good Typical amount of pain: none Does pain affect daily life?: no Are you currently prescribed opioids?: no  Dietary Habits and Nutritional Risks How many meals a day?: 2 Eats fruit and vegetables daily?: yes Most meals are obtained by: preparing own meals; eating out In the last 2 weeks, have you had any of the following?: none Diabetic:: (!) yes Any non-healing wounds?: no How often do you check your BS?: as needed Would you like to be referred to a Nutritionist or for Diabetic Management? : no  Functional Status Activities of Daily Living (to include ambulation/medication): Independent Ambulation: Independent with device- listed below Home Assistive Devices/Equipment: Dentures (specify type) Medication Administration: Independent Home Management: Independent Manage your own finances?: yes Primary transportation is: driving Concerns about vision?: no *vision screening  is required for WTM* Concerns about hearing?: no  Fall Screening Falls in the past year?: 0 Number of falls in past year: 0 Was there an injury with Fall?: 0 Fall Risk Category Calculator: 0 Patient Fall Risk Level: Low Fall Risk  Fall Risk Patient at Risk for Falls Due to: No Fall Risks Fall risk Follow up: Falls evaluation completed; Falls prevention discussed  Home and Transportation Safety: All rugs have non-skid backing?: yes All stairs or steps have railings?: yes (outside) Grab bars in the bathtub or shower?: (!) no Have non-skid surface in bathtub or shower?: yes Good home lighting?: yes Regular seat belt use?: yes Hospital stays in the last year:: no  Cognitive Assessment Difficulty concentrating, remembering, or making decisions? : no Will 6CIT or Mini Cog be Completed: yes What year is it?: 0 points What month is it?: 0 points Give patient an address phrase to remember (5 components): [address] [place], [place] About what time is it?: 0 points Count backwards from 20 to 1: 0 points Say the months of the year in reverse: 0 points Repeat the address phrase from earlier: 0 points 6 CIT Score: 0 points  Advance Directives (For Healthcare) Does Patient Have a Medical Advance Directive?: No  Reviewed/Updated  Reviewed/Updated: Reviewed All (Medical, Surgical, Family, Medications, Allergies, Care Teams, Patient Goals)  Objective:    Today's Vitals   05/06/24 1312  Weight: 219 lb (99.3 kg)  Height: 5' 6 (1.676 m)   Body mass index is 35.35 kg/m.  Current Medications (verified) Outpatient Encounter Medications as of 05/06/2024  Medication Sig   amLODipine  (NORVASC ) 5 MG tablet Take 1 tablet by mouth once daily for blood pressure   ezetimibe  (ZETIA ) 10 MG tablet TAKE 1 TABLET BY MOUTH ONCE DAILY FOR CHOLESTEROL   glipiZIDE  (GLUCOTROL  XL) 5 MG 24 hr tablet Take 1 tablet (5 mg total) by mouth daily with breakfast. for diabetes.   glucose blood  (ONETOUCH ULTRA) test strip USE TO TEST BLOOD SUGAR TWO TO THREE TIMES DAILY   Lancet Devices (ONE TOUCH DELICA LANCING DEV) MISC Use as instructed to test blood sugar daily   meloxicam  (MOBIC ) 15 MG tablet TAKE 1 TABLET BY MOUTH ONCE DAILY AS NEEDED FOR PAIN   metFORMIN  (GLUCOPHAGE -XR) 500 MG 24 hr tablet TAKE 2 TABLETS BY MOUTH ONCE DAILY WITH BREAKFAST FOR DIABETES   RESTASIS 0.05 % ophthalmic emulsion 1 drop 2 (two) times daily.   tamsulosin  (FLOMAX ) 0.4 MG CAPS capsule TAKE 1 CAPSULE BY MOUTH ONCE DAILY AFTER SUPPER FOR URINATION   valsartan -hydrochlorothiazide  (DIOVAN -HCT) 160-12.5 MG tablet Take 1 tablet by mouth once daily for blood pressure   venlafaxine  XR (EFFEXOR -XR) 150 MG 24 hr capsule TAKE 1 CAPSULE BY MOUTH ONCE DAILY WITH BREAKFAST FOR ANXIETY   [DISCONTINUED] BESIVANCE 0.6 % SUSP Place 1 drop into the right eye 3 (three) times daily.   [DISCONTINUED] fluorouracil  (EFUDEX ) 5 % cream Apply to the entire scalp twice a day x 7 days (Patient not taking: Reported on 01/12/2024)   [DISCONTINUED] fluticasone  (FLONASE) 50 MCG/ACT nasal spray Place 2 sprays into both nostrils daily.   No facility-administered encounter medications on file as of 05/06/2024.   Hearing/Vision screen Hearing Screening - Comments:: Denies hearing difficulties   Vision Screening - Comments:: Denies vision concerns - sees Dr Mevelyn every Immunizations and Health Maintenance Health Maintenance  Topic Date Due   Colonoscopy  Never done   FOOT EXAM  07/13/2024   OPHTHALMOLOGY EXAM  07/19/2024   HEMOGLOBIN A1C  10/19/2024   Diabetic kidney evaluation - eGFR measurement  01/11/2025   Diabetic kidney evaluation - Urine ACR  01/11/2025   Medicare Annual Wellness (AWV)  05/06/2025   DTaP/Tdap/Td (2 - Td or Tdap) 02/28/2032   Pneumococcal Vaccine: 50+ Years  Completed   Influenza Vaccine  Completed   Hepatitis C Screening  Completed   Zoster Vaccines- Shingrix  Completed   Meningococcal B Vaccine  Aged  Out   COVID-19 Vaccine  Discontinued        Assessment/Plan:  This is a routine wellness examination for Miklo.  Patient Care Team: Gretta Comer POUR, NP as PCP - General (Internal Medicine) Mevelyn Charleston, OD as Referring Physician (Optometry)  I have personally reviewed and noted the following in the patient's chart:   Medical and social history Use of alcohol, tobacco or illicit drugs  Current medications and supplements including opioid prescriptions. Functional ability and status Nutritional status Physical activity Advanced directives List of other physicians Hospitalizations, surgeries, and ER visits in previous 12 months Vitals Screenings to include cognitive, depression, and falls Referrals and appointments  Orders Placed This Encounter  Procedures   Cologuard   In addition, I have reviewed and discussed with patient certain preventive protocols, quality metrics, and best practice recommendations. A written personalized care plan for preventive services as well  as general preventive health recommendations were provided to patient.   Verdie CHRISTELLA Saba, CMA   05/06/2024   Return in 1 year (on 05/06/2025).  After Visit Summary: (MyChart) Due to this being a telephonic visit, the after visit summary with patients personalized plan was offered to patient via MyChart   Nurse Notes: scheduled 2026 AWV/CPE appts

## 2024-05-06 NOTE — Patient Instructions (Addendum)
 Mr. Chewning,  Thank you for taking the time for your Medicare Wellness Visit. I appreciate your continued commitment to your health goals. Please review the care plan we discussed, and feel free to reach out if I can assist you further.  Please note that Annual Wellness Visits do not include a physical exam. Some assessments may be limited, especially if the visit was conducted virtually. If needed, we may recommend an in-person follow-up with your provider.  Ongoing Care Seeing your primary care provider every 3 to 6 months helps us  monitor your health and provide consistent, personalized care.   Referrals If a referral was made during today's visit and you haven't received any updates within two weeks, please contact the referred provider directly to check on the status.  Recommended Screenings:  Health Maintenance  Topic Date Due   Colon Cancer Screening  Never done   Complete foot exam   07/13/2024   Eye exam for diabetics  07/19/2024   Hemoglobin A1C  10/19/2024   Yearly kidney function blood test for diabetes  01/11/2025   Yearly kidney health urinalysis for diabetes  01/11/2025   Medicare Annual Wellness Visit  05/06/2025   DTaP/Tdap/Td vaccine (2 - Td or Tdap) 02/28/2032   Pneumococcal Vaccine for age over 27  Completed   Flu Shot  Completed   Hepatitis C Screening  Completed   Zoster (Shingles) Vaccine  Completed   Meningitis B Vaccine  Aged Out   COVID-19 Vaccine  Discontinued       05/06/2024    1:14 PM  Advanced Directives  Does Patient Have a Medical Advance Directive? No    Vision: Annual vision screenings are recommended for early detection of glaucoma, cataracts, and diabetic retinopathy. These exams can also reveal signs of chronic conditions such as diabetes and high blood pressure.  Dental: Annual dental screenings help detect early signs of oral cancer, gum disease, and other conditions linked to overall health, including heart disease and diabetes.

## 2024-05-20 LAB — COLOGUARD: COLOGUARD: NEGATIVE

## 2024-05-22 ENCOUNTER — Ambulatory Visit: Payer: Self-pay | Admitting: Primary Care

## 2024-07-14 ENCOUNTER — Ambulatory Visit: Admitting: Primary Care

## 2024-07-14 VITALS — BP 120/70 | HR 69 | Temp 97.5°F | Ht 66.0 in | Wt 218.0 lb

## 2024-07-14 DIAGNOSIS — E1165 Type 2 diabetes mellitus with hyperglycemia: Secondary | ICD-10-CM | POA: Diagnosis not present

## 2024-07-14 DIAGNOSIS — Z7984 Long term (current) use of oral hypoglycemic drugs: Secondary | ICD-10-CM | POA: Diagnosis not present

## 2024-07-14 LAB — MICROALBUMIN / CREATININE URINE RATIO
Creatinine,U: 143.8 mg/dL
Microalb Creat Ratio: UNDETERMINED mg/g (ref 0.0–30.0)
Microalb, Ur: <0.7 mg/dL

## 2024-07-14 LAB — POCT GLYCOSYLATED HEMOGLOBIN (HGB A1C): Hemoglobin A1C: 7.3 % — AB (ref 4.0–5.6)

## 2024-07-14 NOTE — Patient Instructions (Signed)
 Please schedule a follow up visit to meet with me in 6 months.   Work on improving your diet and increase physical activity.  It was a pleasure to see you today!

## 2024-07-14 NOTE — Progress Notes (Signed)
 "  Subjective:    Patient ID: Johnathan Dominguez, male    DOB: 16-Aug-1950, 74 y.o.   MRN: [id number]  Johnathan Dominguez is a very pleasant 74 y.o. male with a history of hypertension, type 2 diabetes, hyperlipidemia, chronic gout who presents today for follow-up diabetes  1) Type 2 Diabetes:  Current medications include: Metformin  ER 1000 mg daily, glipizide  XL 5 mg daily  He is checking his blood glucose on occasion and is getting readings of 160-200s  Last A1C: 7.2 in July 2025, 7.3 today Last Eye Exam: UTD Last Foot Exam: Due Pneumonia Vaccination: 2024 Urine Microalbumin: UTD Statin: intolerant. On zetia   Dietary changes since last visit: Eating poorly during the holidays.    Exercise: None, some walking.      Review of Systems  Eyes:  Negative for visual disturbance.  Respiratory:  Negative for shortness of breath.   Cardiovascular:  Negative for chest pain.  Neurological:  Negative for dizziness and numbness.         Past Medical History:  Diagnosis Date   Acute constipation 11/18/2019   Allergic rhinitis    BPH (benign prostatic hyperplasia)    Essential hypertension    GAD (generalized anxiety disorder)    Hyperlipidemia    Loss of taste 07/17/2021   Osteoarthritis     Social History   Socioeconomic History   Marital status: Married    Spouse name: Not on file   Number of children: Not on file   Years of education: Not on file   Highest education level: 12th grade  Occupational History   Not on file  Tobacco Use   Smoking status: Never   Smokeless tobacco: Never  Vaping Use   Vaping status: Never Used  Substance and Sexual Activity   Alcohol use: Not Currently   Drug use: Never   Sexual activity: Yes  Other Topics Concern   Not on file  Social History Narrative   Not on file   Social Drivers of Health   Tobacco Use: Low Risk (05/06/2024)   Patient History    Smoking Tobacco Use: Never    Smokeless Tobacco Use: Never    Passive  Exposure: Not on file  Financial Resource Strain: Low Risk (01/08/2024)   Overall Financial Resource Strain (CARDIA)    Difficulty of Paying Living Expenses: Not hard at all  Food Insecurity: No Food Insecurity (05/06/2024)   Epic    Worried About Programme Researcher, Broadcasting/film/video in the Last Year: Never true    Ran Out of Food in the Last Year: Never true  Transportation Needs: No Transportation Needs (05/06/2024)   Epic    Lack of Transportation (Medical): No    Lack of Transportation (Non-Medical): No  Physical Activity: Inactive (05/06/2024)   Exercise Vital Sign    Days of Exercise per Week: 0 days    Minutes of Exercise per Session: 0 min  Stress: No Stress Concern Present (05/06/2024)   [employer] of Occupational Health - Occupational Stress Questionnaire    Feeling of Stress: Not at all  Social Connections: Moderately Isolated (05/06/2024)   Social Connection and Isolation Panel    Frequency of Communication with Friends and Family: Twice a week    Frequency of Social Gatherings with Friends and Family: Once a week    Attends Religious Services: Never    Database Administrator or Organizations: No    Attends Banker Meetings: Never    Marital Status: Married  Intimate Partner Violence: Not At Risk (05/06/2024)   Epic    Fear of Current or Ex-Partner: No    Emotionally Abused: No    Physically Abused: No    Sexually Abused: No  Depression (PHQ2-9): Low Risk (05/06/2024)   Depression (PHQ2-9)    PHQ-2 Score: 0  Alcohol Screen: Low Risk (12/29/2022)   Alcohol Screen    Last Alcohol Screening Score (AUDIT): 0  Housing: Unknown (05/06/2024)   Epic    Unable to Pay for Housing in the Last Year: No    Number of Times Moved in the Last Year: Not on file    Homeless in the Last Year: No  Utilities: Not At Risk (05/06/2024)   Epic    Threatened with loss of utilities: No  Health Literacy: Adequate Health Literacy (05/06/2024)   B1300 Health Literacy    Frequency of  need for help with medical instructions: Never    Past Surgical History:  Procedure Laterality Date   CERVICAL SPINE SURGERY     GALLBLADDER SURGERY  1979   HERNIA REPAIR     TONSILLECTOMY AND ADENOIDECTOMY      Family History  Problem Relation Age of Onset   Heart disease Mother    Hypertension Mother    Heart disease Maternal Grandfather     Allergies[1]  Medications Ordered Prior to Encounter[2]  BP 120/70   Pulse 69   Temp (!) 97.5 F (36.4 C) (Oral)   Ht 5' 6 (1.676 m)   Wt 218 lb (98.9 kg)   SpO2 95%   BMI 35.19 kg/m  Objective:   Physical Exam Cardiovascular:     Rate and Rhythm: Normal rate and regular rhythm.  Pulmonary:     Effort: Pulmonary effort is normal.     Breath sounds: Normal breath sounds.  Musculoskeletal:     Cervical back: Neck supple.  Skin:    General: Skin is warm and dry.  Neurological:     Mental Status: He is alert and oriented to person, place, and time.  Psychiatric:        Mood and Affect: Mood normal.     Physical Exam        Assessment & Plan:  Type 2 diabetes mellitus with hyperglycemia, without long-term current use of insulin (HCC) Assessment & Plan: Overall stable with A1C of 7.3.  Continue metformin  ER 1000 mg daily, glipizide  XL 5 mg daily.  Foot exam today. Urine microalbumin pending today.  Follow up in 6 months.   Orders: -     POCT glycosylated hemoglobin (Hb A1C) -     Microalbumin / creatinine urine ratio    Assessment and Plan Assessment & Plan         Comer MARLA Gaskins, NP       [1]  Allergies Allergen Reactions   Statins   [2]  Current Outpatient Medications on File Prior to Visit  Medication Sig Dispense Refill   amLODipine  (NORVASC ) 5 MG tablet Take 1 tablet by mouth once daily for blood pressure 90 tablet 2   ezetimibe  (ZETIA ) 10 MG tablet TAKE 1 TABLET BY MOUTH ONCE DAILY FOR CHOLESTEROL 90 tablet 2   glipiZIDE  (GLUCOTROL  XL) 5 MG 24 hr tablet Take 1 tablet (5 mg  total) by mouth daily with breakfast. for diabetes. 90 tablet 1   glucose blood (ONETOUCH ULTRA) test strip USE TO TEST BLOOD SUGAR TWO TO THREE TIMES DAILY 100 each 0   Lancet Devices (ONE TOUCH DELICA LANCING DEV)  MISC Use as instructed to test blood sugar daily 100 each 5   metFORMIN  (GLUCOPHAGE -XR) 500 MG 24 hr tablet TAKE 2 TABLETS BY MOUTH ONCE DAILY WITH BREAKFAST FOR DIABETES 180 tablet 1   RESTASIS 0.05 % ophthalmic emulsion 1 drop 2 (two) times daily.     tamsulosin  (FLOMAX ) 0.4 MG CAPS capsule TAKE 1 CAPSULE BY MOUTH ONCE DAILY AFTER SUPPER FOR URINATION 90 capsule 2   valsartan -hydrochlorothiazide  (DIOVAN -HCT) 160-12.5 MG tablet Take 1 tablet by mouth once daily for blood pressure 90 tablet 2   venlafaxine  XR (EFFEXOR -XR) 150 MG 24 hr capsule TAKE 1 CAPSULE BY MOUTH ONCE DAILY WITH BREAKFAST FOR ANXIETY 90 capsule 2   No current facility-administered medications on file prior to visit.   "

## 2024-07-14 NOTE — Assessment & Plan Note (Signed)
 Overall stable with A1C of 7.3.  Continue metformin  ER 1000 mg daily, glipizide  XL 5 mg daily.  Foot exam today. Urine microalbumin pending today.  Follow up in 6 months.

## 2024-07-15 ENCOUNTER — Ambulatory Visit: Payer: Self-pay | Admitting: Primary Care

## 2024-09-27 ENCOUNTER — Ambulatory Visit: Admitting: Dermatology

## 2025-05-16 ENCOUNTER — Encounter: Admitting: Primary Care

## 2025-05-16 ENCOUNTER — Ambulatory Visit
